# Patient Record
Sex: Female | Born: 1935 | Race: White | Hispanic: No | Marital: Single | Smoking: Never smoker
Health system: Southern US, Community
[De-identification: ages and names within clinical notes are randomized; demographics above are authoritative.]

## PROBLEM LIST (undated history)

## (undated) DIAGNOSIS — D649 Anemia, unspecified: Secondary | ICD-10-CM

## (undated) DIAGNOSIS — F79 Unspecified intellectual disabilities: Secondary | ICD-10-CM

## (undated) DIAGNOSIS — K5790 Diverticulosis of intestine, part unspecified, without perforation or abscess without bleeding: Secondary | ICD-10-CM

## (undated) DIAGNOSIS — K219 Gastro-esophageal reflux disease without esophagitis: Secondary | ICD-10-CM

## (undated) DIAGNOSIS — I4891 Unspecified atrial fibrillation: Secondary | ICD-10-CM

## (undated) DIAGNOSIS — J189 Pneumonia, unspecified organism: Secondary | ICD-10-CM

## (undated) DIAGNOSIS — G309 Alzheimer's disease, unspecified: Secondary | ICD-10-CM

## (undated) DIAGNOSIS — F0390 Unspecified dementia without behavioral disturbance: Secondary | ICD-10-CM

## (undated) DIAGNOSIS — F0280 Dementia in other diseases classified elsewhere without behavioral disturbance: Secondary | ICD-10-CM

## (undated) DIAGNOSIS — G40909 Epilepsy, unspecified, not intractable, without status epilepticus: Secondary | ICD-10-CM

## (undated) DIAGNOSIS — E785 Hyperlipidemia, unspecified: Secondary | ICD-10-CM

## (undated) DIAGNOSIS — Z86010 Personal history of colonic polyps: Secondary | ICD-10-CM

## (undated) DIAGNOSIS — M1990 Unspecified osteoarthritis, unspecified site: Secondary | ICD-10-CM

## (undated) DIAGNOSIS — E46 Unspecified protein-calorie malnutrition: Secondary | ICD-10-CM

## (undated) DIAGNOSIS — E039 Hypothyroidism, unspecified: Secondary | ICD-10-CM

## (undated) HISTORY — DX: Diverticulosis of intestine, part unspecified, without perforation or abscess without bleeding: K57.90

## (undated) HISTORY — PX: HEMORRHOID SURGERY: SHX153

## (undated) HISTORY — DX: Unspecified osteoarthritis, unspecified site: M19.90

## (undated) HISTORY — DX: Unspecified atrial fibrillation: I48.91

## (undated) HISTORY — DX: Anemia, unspecified: D64.9

## (undated) HISTORY — DX: Hyperlipidemia, unspecified: E78.5

## (undated) HISTORY — DX: Pneumonia, unspecified organism: J18.9

## (undated) HISTORY — DX: Epilepsy, unspecified, not intractable, without status epilepticus: G40.909

## (undated) HISTORY — DX: Dementia in other diseases classified elsewhere, unspecified severity, without behavioral disturbance, psychotic disturbance, mood disturbance, and anxiety: F02.80

## (undated) HISTORY — DX: Unspecified intellectual disabilities: F79

## (undated) HISTORY — DX: Personal history of colonic polyps: Z86.010

## (undated) HISTORY — PX: CHOLECYSTECTOMY: SHX55

## (undated) HISTORY — DX: Alzheimer's disease, unspecified: G30.9

---

## 2008-06-22 DIAGNOSIS — Z86010 Personal history of colonic polyps: Secondary | ICD-10-CM

## 2008-06-22 HISTORY — DX: Personal history of colonic polyps: Z86.010

## 2009-01-12 ENCOUNTER — Ambulatory Visit: Payer: Self-pay | Admitting: Cardiology

## 2009-01-12 ENCOUNTER — Inpatient Hospital Stay (HOSPITAL_COMMUNITY): Admission: EM | Admit: 2009-01-12 | Discharge: 2009-02-01 | Payer: Self-pay | Admitting: Emergency Medicine

## 2009-01-14 ENCOUNTER — Ambulatory Visit: Payer: Self-pay | Admitting: Internal Medicine

## 2009-01-15 ENCOUNTER — Encounter (INDEPENDENT_AMBULATORY_CARE_PROVIDER_SITE_OTHER): Payer: Self-pay | Admitting: Internal Medicine

## 2009-01-15 ENCOUNTER — Ambulatory Visit: Payer: Self-pay | Admitting: Internal Medicine

## 2009-01-17 ENCOUNTER — Ambulatory Visit: Payer: Self-pay | Admitting: Gastroenterology

## 2009-01-18 ENCOUNTER — Ambulatory Visit: Payer: Self-pay | Admitting: Gastroenterology

## 2009-01-18 ENCOUNTER — Encounter: Payer: Self-pay | Admitting: Gastroenterology

## 2009-01-18 HISTORY — PX: ESOPHAGOGASTRODUODENOSCOPY: SHX1529

## 2009-01-18 HISTORY — PX: COLONOSCOPY W/ POLYPECTOMY: SHX1380

## 2009-01-22 ENCOUNTER — Encounter (INDEPENDENT_AMBULATORY_CARE_PROVIDER_SITE_OTHER): Payer: Self-pay | Admitting: Radiology

## 2009-01-28 ENCOUNTER — Encounter: Payer: Self-pay | Admitting: Internal Medicine

## 2009-02-01 ENCOUNTER — Inpatient Hospital Stay: Admission: AD | Admit: 2009-02-01 | Discharge: 2009-02-12 | Payer: Self-pay | Admitting: Internal Medicine

## 2009-02-05 DIAGNOSIS — K2990 Gastroduodenitis, unspecified, without bleeding: Secondary | ICD-10-CM

## 2009-02-05 DIAGNOSIS — I4891 Unspecified atrial fibrillation: Secondary | ICD-10-CM

## 2009-02-05 DIAGNOSIS — D649 Anemia, unspecified: Secondary | ICD-10-CM | POA: Insufficient documentation

## 2009-02-05 DIAGNOSIS — K2970 Gastritis, unspecified, without bleeding: Secondary | ICD-10-CM | POA: Insufficient documentation

## 2009-02-05 DIAGNOSIS — R569 Unspecified convulsions: Secondary | ICD-10-CM

## 2009-02-15 ENCOUNTER — Ambulatory Visit: Payer: Self-pay | Admitting: Cardiology

## 2009-02-15 ENCOUNTER — Encounter (INDEPENDENT_AMBULATORY_CARE_PROVIDER_SITE_OTHER): Payer: Self-pay | Admitting: *Deleted

## 2009-02-15 LAB — CONVERTED CEMR LAB
BUN: 29 mg/dL
CO2: 24 meq/L
Chloride: 101 meq/L
Glucose, Bld: 89 mg/dL
Potassium: 4.5 meq/L

## 2009-02-18 ENCOUNTER — Encounter: Payer: Self-pay | Admitting: Cardiology

## 2009-02-18 LAB — CONVERTED CEMR LAB
Basophils Absolute: 0 10*3/uL (ref 0.0–0.1)
Basophils Relative: 0 % (ref 0–1)
CO2: 24 meq/L (ref 19–32)
Calcium: 9 mg/dL (ref 8.4–10.5)
Hemoglobin: 11.4 g/dL — ABNORMAL LOW (ref 12.0–15.0)
Lymphocytes Relative: 16 % (ref 12–46)
MCHC: 31.1 g/dL (ref 30.0–36.0)
Monocytes Relative: 12 % (ref 3–12)
Neutro Abs: 5.7 10*3/uL (ref 1.7–7.7)
Neutrophils Relative %: 69 % (ref 43–77)
Potassium: 4.5 meq/L (ref 3.5–5.3)
RBC: 4.54 M/uL (ref 3.87–5.11)
Sodium: 135 meq/L (ref 135–145)

## 2009-02-27 ENCOUNTER — Encounter: Payer: Self-pay | Admitting: Cardiology

## 2009-03-01 ENCOUNTER — Telehealth: Payer: Self-pay | Admitting: Adult Health

## 2009-03-04 ENCOUNTER — Telehealth: Payer: Self-pay | Admitting: Adult Health

## 2009-03-06 ENCOUNTER — Ambulatory Visit: Payer: Self-pay | Admitting: Cardiology

## 2009-03-06 ENCOUNTER — Ambulatory Visit (HOSPITAL_COMMUNITY): Admission: RE | Admit: 2009-03-06 | Discharge: 2009-03-06 | Payer: Self-pay | Admitting: Cardiology

## 2009-03-06 ENCOUNTER — Encounter: Payer: Self-pay | Admitting: Adult Health

## 2009-03-06 DIAGNOSIS — M79609 Pain in unspecified limb: Secondary | ICD-10-CM

## 2009-03-06 DIAGNOSIS — Z9189 Other specified personal risk factors, not elsewhere classified: Secondary | ICD-10-CM

## 2009-03-06 LAB — CONVERTED CEMR LAB
Eosinophils Relative: 2 % (ref 0–5)
HCT: 36.5 % (ref 36.0–46.0)
Hemoglobin: 12.4 g/dL (ref 12.0–15.0)
Lymphocytes Relative: 16 % (ref 12–46)
Lymphs Abs: 1.2 10*3/uL (ref 0.7–4.0)
MCV: 80.4 fL (ref 78.0–100.0)
Monocytes Absolute: 1 10*3/uL (ref 0.1–1.0)
WBC: 7.4 10*3/uL (ref 4.0–10.5)

## 2009-04-02 ENCOUNTER — Ambulatory Visit: Payer: Self-pay | Admitting: Cardiology

## 2009-05-21 ENCOUNTER — Ambulatory Visit: Payer: Self-pay | Admitting: Gastroenterology

## 2009-07-05 ENCOUNTER — Encounter (INDEPENDENT_AMBULATORY_CARE_PROVIDER_SITE_OTHER): Payer: Self-pay | Admitting: *Deleted

## 2009-07-15 ENCOUNTER — Ambulatory Visit: Payer: Self-pay | Admitting: Cardiology

## 2009-08-18 ENCOUNTER — Inpatient Hospital Stay (HOSPITAL_COMMUNITY): Admission: EM | Admit: 2009-08-18 | Discharge: 2009-09-02 | Payer: Self-pay | Admitting: Emergency Medicine

## 2009-08-22 ENCOUNTER — Ambulatory Visit: Payer: Self-pay | Admitting: Internal Medicine

## 2009-08-25 ENCOUNTER — Encounter: Payer: Self-pay | Admitting: Internal Medicine

## 2009-09-02 ENCOUNTER — Inpatient Hospital Stay: Admission: AD | Admit: 2009-09-02 | Discharge: 2009-10-15 | Payer: Self-pay | Admitting: Internal Medicine

## 2010-04-04 ENCOUNTER — Ambulatory Visit: Payer: Self-pay | Admitting: Cardiology

## 2010-07-22 NOTE — Assessment & Plan Note (Signed)
Summary: E4V   Visit Type:  Follow-up Primary [name] [name]:  Dr. Dwana Melena   History of Present Illness: 77 year old woman presents for followup. I last saw her in October 2010 and she was seen in the interim by Ms. Lawrence in January of this year. She is here with her son. Her family is very supportive and provides all of her medications. She does not endorse any complaints of chest pain or palpitations, and her son states that she's been doing very well. Labs have been followed by Dr. Margo Aye over the last several months. Her TSH was 10.0 back in March, AST and ALT were normal as of June. Lipids also looked good at that time with an LDL of 85.  I reviewed her medications. She remains on low-dose amiodarone. We did discuss the possibility of cutting it back even further. She is not on Coumadin with history of falls and also seizure disorder. She has been able to take aspirin.  Current Medications (verified): 1)  Aricept 5 Mg Tabs (Donepezil Hcl) .... Take 1 Tab Daily 2)  Simvastatin 20 Mg Tabs (Simvastatin) .... Take 1 Tab Daily 3)  Aspirin 81 Mg Tbec (Aspirin) .... Take One Tablet By Mouth Daily 4)  Nu-Iron 150 Mg Caps (Polysaccharide Iron Complex) .... Take 1 Tab Two Times A Day 5)  Omeprazole 20 Mg Cpdr (Omeprazole) .... Take 1 Tab Daily 6)  Amiodarone Hcl 200 Mg Tabs (Amiodarone Hcl) .... Take 1 Tablet By Mouth Once Daily 7)  Klor-Con M20 20 Meq Cr-Tabs (Potassium Chloride Crys Cr) .... Take 2 Tabs Daily 8)  Caltrate 600+d Plus 600-400 Mg-Unit Tabs (Calcium Carbonate-Vit D-Min) 9)  Colace 100 Mg Caps (Docusate Sodium) .... Take One Tablet By Mouth At Bedtime 10)  Ocuvite  Tabs (Multiple Vitamins-Minerals) .... Take 1 Tab Daily  Allergies (verified): No Known Drug Allergies  Past History:  Past Medical History: Last updated: 05/21/2009 Anemia - gastritis/duodenitis Atrial Fibrillation Seizure disorder Mental retardation Alzheimer dementia Hyperlipidemia Osteoarthritis Colonic  polyps and diverticulosis: TCS 2010 Pneumonia August 2010  Social History: Last updated: 02/05/2009 Disabled  Single  Tobacco Use - No.  Alcohol Use - no Regular Exercise - no Drug Use - no  Review of Systems  The patient denies anorexia, fever, chest pain, syncope, dyspnea on exertion, peripheral edema, and abdominal pain.         Otherwise reviewed and negative.  Vital Signs:  Patient profile:   77 year old female Weight:      93 pounds BMI:     20.20 Pulse rate:   77 / minute BP sitting:   115 / 57  (right arm)  Vitals Entered By: Dreama Saa, CNA (April 04, 2010 9:17 AM)  Physical Exam  Additional Exam:  Chronically ill appearing, elderly woman, no acute distress. HEENT: Conjunctiva lids normal, oropharynx with poor dentition Neck: Supple, no carotid bruits or thyromegaly. Lungs: Clear with diminished breath sounds. Nonlabored. Cardiac: Regular rate and rhythm, somewhat distant, no loud systolic murmur or gallop. Extremities: No pitting edema, distal pulses one plus. Neuropsychiatric: Alert and oriented x2.   EKG  Procedure date:  04/04/2010  Findings:      Sinus rhythm at 66 beats per minute with incomplete right bundle branch block, left anterior fascicular block, nonspecific ST changes, QTC 469 ms.  Impression & Recommendations:  Problem # 1:  ATRIAL FIBRILLATION, PAROXYSMAL (ICD-427.31)  Maintaining sinus rhythm with no complaint of palpitations on amiodarone and aspirin. No Coumadin with history of falls and seizure disorder.  I recommended that she get followup TSH and LFTs with Dr. Margo Aye. Next routine visit in 6 months.  Her updated medication list for this problem includes:    Aspirin 81 Mg Tbec (Aspirin) .Marland Kitchen... Take one tablet by mouth daily    Amiodarone Hcl 200 Mg Tabs (Amiodarone hcl) .Marland Kitchen... Take 1 tablet by mouth once daily  Patient Instructions: 1)  Your physician recommends that you schedule a follow-up appointment in: 6 MONTHS 2)  Your  physician recommends that you continue on your current medications as directed. Please refer to the Current Medication list given to you today.

## 2010-07-22 NOTE — Miscellaneous (Signed)
Summary: LABS 02/15/2009 BMP  Clinical Lists Changes  Observations: Added new observation of CALCIUM: 9.0 mg/dL (04/54/0981 19:14) Added new observation of CREATININE: 0.95 mg/dL (78/29/5621 30:86) Added new observation of BUN: 29 mg/dL (57/84/6962 95:28) Added new observation of BG RANDOM: 89 mg/dL (41/32/4401 02:72) Added new observation of CO2 PLSM/SER: 24 meq/L (02/15/2009 16:55) Added new observation of CL SERUM: 101 meq/L (02/15/2009 16:55) Added new observation of K SERUM: 4.5 meq/L (02/15/2009 16:55) Added new observation of NA: 135 meq/L (02/15/2009 16:55)

## 2010-07-22 NOTE — Assessment & Plan Note (Signed)
Summary: F3M   Visit Type:  Follow-up Primary Provider:  Margo Aye, M.D.    History of Present Illness: [name] [name] is a very sweet mentally retarded CF we are following for afib.  Her cousin is here with her as always.  We intially saw her on consultation in the hospital for AFib and she has been on amioderone since. At Last visit with Dr. Diona Browner in Oct 2010, her amioderone was decreased to 200mg  daily.  She has tolerated this well, with no recurrance of afib RVR.  She has had no further falls. She is compliant with her medications.  Her cousin, with whom she lives, makes sure that she takes them.  She has no complaints.   Current Problems (verified): 1)  Accidental Fall, Hx of  (ICD-V15.89) 2)  Accidental Fall On or From Sidewalk Curb  (ICD-E880.1) 3)  Limb Pain  (ICD-729.5) 4)  Atrial Fibrillation With Rapid Ventricular Response  (ICD-427.31) 5)  Gastritis  (ICD-535.50) 6)  Seizure Disorder  (ICD-780.39) 7)  Atrial Fibrillation, Paroxysmal  (ICD-427.31) 8)  Anemia  (ICD-285.9)  Current Medications (verified): 1)  Aricept 5 Mg Tabs (Donepezil Hcl) .... Take 1 Tab Daily 2)  Simvastatin 20 Mg Tabs (Simvastatin) .... Take 1 Tab Daily 3)  Aspirin 81 Mg Tbec (Aspirin) .... Take One Tablet By Mouth Daily 4)  Nu-Iron 150 Mg Caps (Polysaccharide Iron Complex) .... Take 1 Tab Daily 5)  Omeprazole 20 Mg Cpdr (Omeprazole) .... Take 1 Tab Daily 6)  Amiodarone Hcl 200 Mg Tabs (Amiodarone Hcl) .... Take 1 Tablet By Mouth Once Daily 7)  Klor-Con M20 20 Meq Cr-Tabs (Potassium Chloride Crys Cr) .... Take 2 Tabs Daily 8)  Daily Multi  Tabs (Multiple Vitamins-Minerals) 9)  Vitamin B-12 100 Mcg Tabs (Cyanocobalamin) 10)  Caltrate 600+d Plus 600-400 Mg-Unit Tabs (Calcium Carbonate-Vit D-Min) 11)  Colace 100 Mg Caps (Docusate Sodium) .... Take One Tablet By Mouth At Bedtime 12)  Fosamax 70 Mg Tabs (Alendronate Sodium) .... Take 1 Tab Weekly  Allergies (verified): No Known Drug Allergies PMH-FH-SH  reviewed-no changes except otherwise noted  Review of Systems  The patient denies anorexia, fever, weight loss, weight gain, vision loss, decreased hearing, hoarseness, chest pain, syncope, dyspnea on exertion, peripheral edema, prolonged cough, headaches, hemoptysis, abdominal pain, melena, hematochezia, severe indigestion/heartburn, hematuria, incontinence, genital sores, muscle weakness, suspicious skin lesions, transient blindness, difficulty walking, depression, unusual weight change, abnormal bleeding, enlarged lymph nodes, angioedema, breast masses, and testicular masses.    Vital Signs:  Patient profile:   77 year old female Weight:      97 pounds Pulse rate:   69 / minute BP sitting:   122 / 55  (right arm)  Vitals Entered By: Dreama Saa, CNA (July 15, 2009 2:22 PM)  Physical Exam  General:  Well developed, well nourished, in no acute distress. Head:  normocephalic and atraumatic Eyes:  PERRLA/EOM intact; conjunctiva and lids normal. Ears:  TM's intact and clear with normal canals and hearing Nose:  no deformity, discharge, inflammation, or lesions Mouth:  Teeth, gums and palate normal. Oral mucosa normal. Lungs:  Clear bilaterally to auscultation and percussion. Heart:  Non-displaced PMI, chest non-tender; regular rate and rhythm, S1, S2 without murmurs, rubs or gallops. Carotid upstroke normal, no bruit. Normal abdominal aortic size, no bruits. Femorals normal pulses, no bruits. Pedals normal pulses. No edema, no varicosities. Abdomen:  Bowel sounds positive; abdomen soft and non-tender without masses, organomegaly, or hernias noted. No hepatosplenomegaly. Msk:  Back normal, normal gait. Muscle  strength and tone normal. Extremities:  No clubbing or cyanosis. Neurologic:  Alert and oriented x 3. Psych:  Normal affect.   Impression & Recommendations:  Problem # 1:  ATRIAL FIBRILLATION WITH RAPID VENTRICULAR RESPONSE (ICD-427.31) Assessment Unchanged She remains in  NSR with controlled rate on lower dose of amioderone.  No complaints of breathing problems or issues.  Will need TSH checked on next blood draw with primary.  She is stable at this time from CV standpoint. Her updated medication list for this problem includes:    Aspirin 81 Mg Tbec (Aspirin) .Marland Kitchen... Take one tablet by mouth daily    Amiodarone Hcl 200 Mg Tabs (Amiodarone hcl) .Marland Kitchen... Take 1 tablet by mouth once daily  Patient Instructions: 1)  Your physician recommends that you schedule a follow-up appointment in: 6 months 2)  Your physician recommends that you continue on your current medications as directed. Please refer to the Current Medication list given to you today. Prescriptions: AMIODARONE HCL 200 MG TABS (AMIODARONE HCL) Take 1 tablet by mouth once daily  #30 x 6   Entered by:   Larita Fife Via LPN   Authorized by:   Joni Reining, NP   Signed by:   Larita Fife Via LPN on 13/01/6577   Method used:   Electronically to        [facility] (retail)       [address]       [place], [place]  [address]       Ph: [phone]       Fax: [phone]   RxID:   [id number]

## 2010-09-11 LAB — DIFFERENTIAL
Basophils Relative: 0 % (ref 0–1)
Basophils Relative: 1 % (ref 0–1)
Eosinophils Absolute: 0.1 10*3/uL (ref 0.0–0.7)
Eosinophils Relative: 1 % (ref 0–5)
Lymphocytes Relative: 3 % — ABNORMAL LOW (ref 12–46)
Monocytes Absolute: 0.5 10*3/uL (ref 0.1–1.0)
Monocytes Absolute: 0.7 10*3/uL (ref 0.1–1.0)
Monocytes Relative: 5 % (ref 3–12)
Monocytes Relative: 8 % (ref 3–12)
Neutro Abs: 9.9 10*3/uL — ABNORMAL HIGH (ref 1.7–7.7)
Neutrophils Relative %: 83 % — ABNORMAL HIGH (ref 43–77)

## 2010-09-11 LAB — URINALYSIS, ROUTINE W REFLEX MICROSCOPIC
Bilirubin Urine: NEGATIVE
Glucose, UA: NEGATIVE mg/dL
Hgb urine dipstick: NEGATIVE
Ketones, ur: NEGATIVE mg/dL
Protein, ur: NEGATIVE mg/dL

## 2010-09-11 LAB — POCT I-STAT, CHEM 8
BUN: 19 mg/dL (ref 6–23)
Chloride: 109 mEq/L (ref 96–112)
Sodium: 136 mEq/L (ref 135–145)

## 2010-09-11 LAB — POCT CARDIAC MARKERS
CKMB, poc: <1 ng/mL — ABNORMAL LOW (ref 1.0–8.0)
Myoglobin, poc: 43.1 ng/mL (ref 12–200)
Troponin i, poc: <0.05 ng/mL (ref 0.00–0.09)

## 2010-09-11 LAB — MAGNESIUM: Magnesium: 1.9 mg/dL (ref 1.5–2.5)

## 2010-09-11 LAB — CBC
Hemoglobin: 11.1 g/dL — ABNORMAL LOW (ref 12.0–15.0)
Hemoglobin: 13.2 g/dL (ref 12.0–15.0)
MCHC: 35 g/dL (ref 30.0–36.0)
RBC: 3.56 MIL/uL — ABNORMAL LOW (ref 3.87–5.11)
RBC: 4.17 MIL/uL (ref 3.87–5.11)
RDW: 13.7 % (ref 11.5–15.5)

## 2010-09-11 LAB — COMPREHENSIVE METABOLIC PANEL
ALT: 16 U/L (ref 0–35)
AST: 19 U/L (ref 0–37)
Alkaline Phosphatase: 34 U/L — ABNORMAL LOW (ref 39–117)
GFR calc Af Amer: >60 mL/min (ref >60)
Glucose, Bld: 88 mg/dL (ref 70–99)
Potassium: 3.9 mEq/L (ref 3.5–5.1)
Sodium: 140 mEq/L (ref 135–145)
Total Protein: 4.9 g/dL — ABNORMAL LOW (ref 6.0–8.3)

## 2010-09-11 LAB — CULTURE, BLOOD (ROUTINE X 2): Culture: NO GROWTH

## 2010-09-11 LAB — CARDIAC PANEL(CRET KIN+CKTOT+MB+TROPI)
CK, MB: 1.5 ng/mL (ref 0.3–4.0)
CK, MB: 1.5 ng/mL (ref 0.3–4.0)
Total CK: 43 U/L (ref 7–177)

## 2010-09-11 LAB — CK TOTAL AND CKMB (NOT AT ARMC): Relative Index: INVALID (ref 0.0–2.5)

## 2010-09-11 LAB — URINE CULTURE
Colony Count: NO GROWTH
Culture: NO GROWTH

## 2010-09-11 LAB — RAPID URINE DRUG SCREEN, HOSP PERFORMED
Opiates: NOT DETECTED
Tetrahydrocannabinol: NOT DETECTED

## 2010-09-11 LAB — SALICYLATE LEVEL: Salicylate Lvl: <4 mg/dL (ref 2.8–20.0)

## 2010-09-11 LAB — URINE MICROSCOPIC-ADD ON

## 2010-09-15 LAB — BLOOD GAS, ARTERIAL
Acid-Base Excess: 1.9 mmol/L (ref 0.0–2.0)
Acid-base deficit: 7.1 mmol/L — ABNORMAL HIGH (ref 0.0–2.0)
Acid-base deficit: 3.7 mmol/L — ABNORMAL HIGH (ref 0.0–2.0)
Acid-base deficit: 4.3 mmol/L — ABNORMAL HIGH (ref 0.0–2.0)
Acid-base deficit: 4.8 mmol/L — ABNORMAL HIGH (ref 0.0–2.0)
Acid-base deficit: 5 mmol/L — ABNORMAL HIGH (ref 0.0–2.0)
Acid-base deficit: 5.6 mmol/L — ABNORMAL HIGH (ref 0.0–2.0)
Acid-base deficit: 7.2 mmol/L — ABNORMAL HIGH (ref 0.0–2.0)
Bicarbonate: 16.3 mEq/L — ABNORMAL LOW (ref 20.0–24.0)
Bicarbonate: 19.8 mEq/L — ABNORMAL LOW (ref 20.0–24.0)
Bicarbonate: 20.1 mEq/L (ref 20.0–24.0)
Bicarbonate: 20.3 mEq/L (ref 20.0–24.0)
Bicarbonate: 25.3 mEq/L — ABNORMAL HIGH (ref 20.0–24.0)
Bicarbonate: 30.9 mEq/L — ABNORMAL HIGH (ref 20.0–24.0)
Bicarbonate: 32.9 mEq/L — ABNORMAL HIGH (ref 20.0–24.0)
Drawn by: 280971
Drawn by: 29757
Drawn by: 297571
FIO2: 0.4 %
FIO2: 0.4 %
FIO2: 0.5 %
FIO2: 0.6 %
FIO2: 40 %
FIO2: 50 %
MECHVT: 0.38 mL
MECHVT: 340 mL
MECHVT: 340 mL
MECHVT: 340 mL
MECHVT: 350 mL
MECHVT: 380 mL
MECHVT: 380 mL
MECHVT: 380 mL
MECHVT: 380 mL
O2 Content: 3.5 L/min
O2 Content: 50 L/min
O2 Content: 40 L/min
O2 Saturation: 92.3 %
O2 Saturation: 93.1 %
O2 Saturation: 94.8 %
O2 Saturation: 95 %
O2 Saturation: 96.9 %
O2 Saturation: 97.3 %
O2 Saturation: 98.3 %
O2 Saturation: 99.4 %
PEEP: 10 cmH2O
PEEP: 5 cmH2O
PEEP: 5 cmH2O
PEEP: 5 cmH2O
PEEP: 5 cmH2O
Patient temperature: 37
Patient temperature: 37
Patient temperature: 100.2
Patient temperature: 98.6
Patient temperature: 98.6
Patient temperature: 98.6
Patient temperature: 98.6
Patient temperature: 98.6
Patient temperature: 98.6
Patient temperature: 98.6
Pressure support: 5 cmH2O
RATE: 12 resp/min
RATE: 18 resp/min
RATE: 20 resp/min
RATE: 20 resp/min
RATE: 20 resp/min
RATE: 20 resp/min
RATE: 30 resp/min
RATE: 30 resp/min
TCO2: 14.6 mmol/L (ref 0–100)
TCO2: 20.7 mmol/L (ref 0–100)
TCO2: 20.8 mmol/L (ref 0–100)
TCO2: 21.2 mmol/L (ref 0–100)
TCO2: 21.6 mmol/L (ref 0–100)
TCO2: 21.9 mmol/L (ref 0–100)
TCO2: 26.4 mmol/L (ref 0–100)
TCO2: 32 mmol/L (ref 0–100)
pCO2 arterial: 24.7 mmHg — ABNORMAL LOW (ref 35.0–45.0)
pCO2 arterial: 36.4 mmHg (ref 35.0–45.0)
pCO2 arterial: 37.2 mmHg (ref 35.0–45.0)
pCO2 arterial: 37.6 mmHg (ref 35.0–45.0)
pCO2 arterial: 39.1 mmHg (ref 35.0–45.0)
pCO2 arterial: 39.3 mmHg (ref 35.0–45.0)
pCO2 arterial: 42.3 mmHg (ref 35.0–45.0)
pCO2 arterial: 42.3 mmHg (ref 35.0–45.0)
pCO2 arterial: 49.8 mmHg — ABNORMAL HIGH (ref 35.0–45.0)
pH, Arterial: 7.436 — ABNORMAL HIGH (ref 7.350–7.400)
pH, Arterial: 7.474 — ABNORMAL HIGH (ref 7.350–7.400)
pH, Arterial: 7.302 — ABNORMAL LOW (ref 7.350–7.400)
pH, Arterial: 7.314 — ABNORMAL LOW (ref 7.350–7.400)
pH, Arterial: 7.347 — ABNORMAL LOW (ref 7.350–7.400)
pH, Arterial: 7.529 — ABNORMAL HIGH (ref 7.350–7.400)
pH, Arterial: 7.545 — ABNORMAL HIGH (ref 7.350–7.400)
pO2, Arterial: 57.9 mmHg — ABNORMAL LOW (ref 80.0–100.0)
pO2, Arterial: 60.6 mmHg — ABNORMAL LOW (ref 80.0–100.0)
pO2, Arterial: 79.3 mmHg — ABNORMAL LOW (ref 80.0–100.0)
pO2, Arterial: 85.4 mmHg (ref 80.0–100.0)
pO2, Arterial: 86.4 mmHg (ref 80.0–100.0)

## 2010-09-15 LAB — CULTURE, BLOOD (ROUTINE X 2)
Culture: NO GROWTH
Report Status: 3072011

## 2010-09-15 LAB — PROTIME-INR
INR: 1.4 (ref 0.00–1.49)
INR: 1.35 (ref 0.00–1.49)
Prothrombin Time: 17 s — ABNORMAL HIGH (ref 11.6–15.2)
Prothrombin Time: 16.6 seconds — ABNORMAL HIGH (ref 11.6–15.2)

## 2010-09-15 LAB — CBC
HCT: 31.5 % — ABNORMAL LOW (ref 36.0–46.0)
HCT: 33 % — ABNORMAL LOW (ref 36.0–46.0)
HCT: 28 % — ABNORMAL LOW (ref 36.0–46.0)
HCT: 29.5 % — ABNORMAL LOW (ref 36.0–46.0)
HCT: 30.1 % — ABNORMAL LOW (ref 36.0–46.0)
HCT: 30.1 % — ABNORMAL LOW (ref 36.0–46.0)
HCT: 30.5 % — ABNORMAL LOW (ref 36.0–46.0)
HCT: 30.5 % — ABNORMAL LOW (ref 36.0–46.0)
Hemoglobin: 11.1 g/dL — ABNORMAL LOW (ref 12.0–15.0)
Hemoglobin: 11.6 g/dL — ABNORMAL LOW (ref 12.0–15.0)
Hemoglobin: 10.4 g/dL — ABNORMAL LOW (ref 12.0–15.0)
Hemoglobin: 10.4 g/dL — ABNORMAL LOW (ref 12.0–15.0)
Hemoglobin: 11.1 g/dL — ABNORMAL LOW (ref 12.0–15.0)
Hemoglobin: 9.7 g/dL — ABNORMAL LOW (ref 12.0–15.0)
Hemoglobin: 9.9 g/dL — ABNORMAL LOW (ref 12.0–15.0)
MCHC: 35.2 g/dL (ref 30.0–36.0)
MCHC: 34.2 g/dL (ref 30.0–36.0)
MCHC: 34.3 g/dL (ref 30.0–36.0)
MCHC: 34.4 g/dL (ref 30.0–36.0)
MCHC: 34.5 g/dL (ref 30.0–36.0)
MCHC: 34.6 g/dL (ref 30.0–36.0)
MCHC: 34.7 g/dL (ref 30.0–36.0)
MCV: 89.2 fL (ref 78.0–100.0)
MCV: 89.7 fL (ref 78.0–100.0)
MCV: 89.9 fL (ref 78.0–100.0)
MCV: 90.1 fL (ref 78.0–100.0)
MCV: 90.5 fL (ref 78.0–100.0)
MCV: 90.8 fL (ref 78.0–100.0)
Platelets: 105 K/uL — ABNORMAL LOW (ref 150–400)
Platelets: 113 10*3/uL — ABNORMAL LOW (ref 150–400)
Platelets: 113 10*3/uL — ABNORMAL LOW (ref 150–400)
Platelets: 116 10*3/uL — ABNORMAL LOW (ref 150–400)
Platelets: 137 10*3/uL — ABNORMAL LOW (ref 150–400)
Platelets: 168 10*3/uL (ref 150–400)
Platelets: 175 10*3/uL (ref 150–400)
Platelets: 180 10*3/uL (ref 150–400)
RBC: 3.53 MIL/uL — ABNORMAL LOW (ref 3.87–5.11)
RBC: 3.67 MIL/uL — ABNORMAL LOW (ref 3.87–5.11)
RBC: 3.12 MIL/uL — ABNORMAL LOW (ref 3.87–5.11)
RBC: 3.15 MIL/uL — ABNORMAL LOW (ref 3.87–5.11)
RBC: 3.32 MIL/uL — ABNORMAL LOW (ref 3.87–5.11)
RBC: 3.34 MIL/uL — ABNORMAL LOW (ref 3.87–5.11)
RBC: 3.55 MIL/uL — ABNORMAL LOW (ref 3.87–5.11)
RBC: 3.66 MIL/uL — ABNORMAL LOW (ref 3.87–5.11)
RDW: 13.7 % (ref 11.5–15.5)
RDW: 13.7 % (ref 11.5–15.5)
RDW: 13.6 % (ref 11.5–15.5)
RDW: 13.8 % (ref 11.5–15.5)
RDW: 13.9 % (ref 11.5–15.5)
RDW: 14 % (ref 11.5–15.5)
RDW: 14 % (ref 11.5–15.5)
RDW: 14.2 % (ref 11.5–15.5)
RDW: 14.5 % (ref 11.5–15.5)
WBC: 8 K/uL (ref 4.0–10.5)
WBC: 11 10*3/uL — ABNORMAL HIGH (ref 4.0–10.5)
WBC: 12.3 10*3/uL — ABNORMAL HIGH (ref 4.0–10.5)
WBC: 12.8 10*3/uL — ABNORMAL HIGH (ref 4.0–10.5)
WBC: 22 10*3/uL — ABNORMAL HIGH (ref 4.0–10.5)
WBC: 6.5 10*3/uL (ref 4.0–10.5)
WBC: 9 10*3/uL (ref 4.0–10.5)
WBC: 9.4 10*3/uL (ref 4.0–10.5)

## 2010-09-15 LAB — GLUCOSE, CAPILLARY
Glucose-Capillary: 106 mg/dL — ABNORMAL HIGH (ref 70–99)
Glucose-Capillary: 113 mg/dL — ABNORMAL HIGH (ref 70–99)
Glucose-Capillary: 119 mg/dL — ABNORMAL HIGH (ref 70–99)
Glucose-Capillary: 121 mg/dL — ABNORMAL HIGH (ref 70–99)
Glucose-Capillary: 125 mg/dL — ABNORMAL HIGH (ref 70–99)
Glucose-Capillary: 125 mg/dL — ABNORMAL HIGH (ref 70–99)
Glucose-Capillary: 128 mg/dL — ABNORMAL HIGH (ref 70–99)
Glucose-Capillary: 129 mg/dL — ABNORMAL HIGH (ref 70–99)
Glucose-Capillary: 141 mg/dL — ABNORMAL HIGH (ref 70–99)
Glucose-Capillary: 142 mg/dL — ABNORMAL HIGH (ref 70–99)
Glucose-Capillary: 143 mg/dL — ABNORMAL HIGH (ref 70–99)
Glucose-Capillary: 145 mg/dL — ABNORMAL HIGH (ref 70–99)
Glucose-Capillary: 163 mg/dL — ABNORMAL HIGH (ref 70–99)
Glucose-Capillary: 186 mg/dL — ABNORMAL HIGH (ref 70–99)
Glucose-Capillary: 77 mg/dL (ref 70–99)
Glucose-Capillary: 88 mg/dL (ref 70–99)

## 2010-09-15 LAB — URINALYSIS, ROUTINE W REFLEX MICROSCOPIC
Nitrite: NEGATIVE
Protein, ur: NEGATIVE mg/dL
Specific Gravity, Urine: 1.025 (ref 1.005–1.030)
Urobilinogen, UA: 0.2 mg/dL (ref 0.0–1.0)

## 2010-09-15 LAB — COMPREHENSIVE METABOLIC PANEL WITH GFR
AST: 51 U/L — ABNORMAL HIGH (ref 0–37)
Albumin: 2.9 g/dL — ABNORMAL LOW (ref 3.5–5.2)
BUN: 10 mg/dL (ref 6–23)
Calcium: 8.3 mg/dL — ABNORMAL LOW (ref 8.4–10.5)
Creatinine, Ser: 1.17 mg/dL (ref 0.4–1.2)
GFR calc Af Amer: 54 mL/min — ABNORMAL LOW (ref >60)
GFR calc non Af Amer: 45 mL/min — ABNORMAL LOW (ref >60)

## 2010-09-15 LAB — BASIC METABOLIC PANEL
BUN: 19 mg/dL (ref 6–23)
BUN: 20 mg/dL (ref 6–23)
BUN: 21 mg/dL (ref 6–23)
BUN: 24 mg/dL — ABNORMAL HIGH (ref 6–23)
BUN: 26 mg/dL — ABNORMAL HIGH (ref 6–23)
BUN: 26 mg/dL — ABNORMAL HIGH (ref 6–23)
CO2: 20 mEq/L (ref 19–32)
CO2: 22 mEq/L (ref 19–32)
CO2: 22 mEq/L (ref 19–32)
CO2: 30 mEq/L (ref 19–32)
CO2: 32 mEq/L (ref 19–32)
CO2: 36 mEq/L — ABNORMAL HIGH (ref 19–32)
Calcium: 7.3 mg/dL — ABNORMAL LOW (ref 8.4–10.5)
Calcium: 7.9 mg/dL — ABNORMAL LOW (ref 8.4–10.5)
Calcium: 8 mg/dL — ABNORMAL LOW (ref 8.4–10.5)
Calcium: 8 mg/dL — ABNORMAL LOW (ref 8.4–10.5)
Calcium: 8.2 mg/dL — ABNORMAL LOW (ref 8.4–10.5)
Chloride: 102 mEq/L (ref 96–112)
Chloride: 104 mEq/L (ref 96–112)
Chloride: 105 mEq/L (ref 96–112)
Chloride: 108 mEq/L (ref 96–112)
Chloride: 108 mEq/L (ref 96–112)
Creatinine, Ser: 0.8 mg/dL (ref 0.4–1.2)
Creatinine, Ser: 0.8 mg/dL (ref 0.4–1.2)
Creatinine, Ser: 0.87 mg/dL (ref 0.4–1.2)
Creatinine, Ser: 0.89 mg/dL (ref 0.4–1.2)
Creatinine, Ser: 0.92 mg/dL (ref 0.4–1.2)
Creatinine, Ser: 0.96 mg/dL (ref 0.4–1.2)
Creatinine, Ser: 0.98 mg/dL (ref 0.4–1.2)
GFR calc Af Amer: 57 mL/min — ABNORMAL LOW (ref >60)
GFR calc Af Amer: >60 mL/min (ref >60)
GFR calc Af Amer: >60 mL/min (ref >60)
GFR calc Af Amer: >60 mL/min (ref >60)
GFR calc Af Amer: >60 mL/min (ref >60)
GFR calc Af Amer: >60 mL/min (ref >60)
GFR calc non Af Amer: 47 mL/min — ABNORMAL LOW (ref >60)
GFR calc non Af Amer: 59 mL/min — ABNORMAL LOW (ref >60)
GFR calc non Af Amer: >60 mL/min (ref >60)
GFR calc non Af Amer: >60 mL/min (ref >60)
GFR calc non Af Amer: >60 mL/min (ref >60)
GFR calc non Af Amer: >60 mL/min (ref >60)
Glucose, Bld: 121 mg/dL — ABNORMAL HIGH (ref 70–99)
Glucose, Bld: 125 mg/dL — ABNORMAL HIGH (ref 70–99)
Glucose, Bld: 131 mg/dL — ABNORMAL HIGH (ref 70–99)
Glucose, Bld: 174 mg/dL — ABNORMAL HIGH (ref 70–99)
Glucose, Bld: 97 mg/dL (ref 70–99)
Potassium: 2.8 mEq/L — ABNORMAL LOW (ref 3.5–5.1)
Potassium: 3.8 mEq/L (ref 3.5–5.1)
Potassium: 3.9 mEq/L (ref 3.5–5.1)
Potassium: 4.1 mEq/L (ref 3.5–5.1)
Sodium: 135 mEq/L (ref 135–145)
Sodium: 141 mEq/L (ref 135–145)
Sodium: 143 mEq/L (ref 135–145)

## 2010-09-15 LAB — MAGNESIUM
Magnesium: 1.8 mg/dL (ref 1.5–2.5)
Magnesium: 2.1 mg/dL (ref 1.5–2.5)
Magnesium: 2.1 mg/dL (ref 1.5–2.5)
Magnesium: 2.2 mg/dL (ref 1.5–2.5)
Magnesium: 2.3 mg/dL (ref 1.5–2.5)
Magnesium: 2.3 mg/dL (ref 1.5–2.5)

## 2010-09-15 LAB — COMPREHENSIVE METABOLIC PANEL
ALT: 25 U/L (ref 0–35)
ALT: 38 U/L — ABNORMAL HIGH (ref 0–35)
ALT: 15 U/L (ref 0–35)
Alkaline Phosphatase: 48 U/L (ref 39–117)
Alkaline Phosphatase: 52 U/L (ref 39–117)
Alkaline Phosphatase: 36 U/L — ABNORMAL LOW (ref 39–117)
BUN: 10 mg/dL (ref 6–23)
BUN: 14 mg/dL (ref 6–23)
BUN: 8 mg/dL (ref 6–23)
CO2: 20 mEq/L (ref 19–32)
CO2: 23 mEq/L (ref 19–32)
CO2: 20 mEq/L (ref 19–32)
CO2: 21 mEq/L (ref 19–32)
CO2: 22 mEq/L (ref 19–32)
Calcium: 7.5 mg/dL — ABNORMAL LOW (ref 8.4–10.5)
Chloride: 112 mEq/L (ref 96–112)
Chloride: 110 mEq/L (ref 96–112)
Chloride: 111 mEq/L (ref 96–112)
Creatinine, Ser: 0.88 mg/dL (ref 0.4–1.2)
Creatinine, Ser: 0.89 mg/dL (ref 0.4–1.2)
GFR calc Af Amer: >60 mL/min (ref >60)
GFR calc non Af Amer: 56 mL/min — ABNORMAL LOW (ref >60)
GFR calc non Af Amer: >60 mL/min (ref >60)
GFR calc non Af Amer: >60 mL/min (ref >60)
Glucose, Bld: 101 mg/dL — ABNORMAL HIGH (ref 70–99)
Glucose, Bld: 109 mg/dL — ABNORMAL HIGH (ref 70–99)
Glucose, Bld: 123 mg/dL — ABNORMAL HIGH (ref 70–99)
Glucose, Bld: 86 mg/dL (ref 70–99)
Potassium: 3.4 mEq/L — ABNORMAL LOW (ref 3.5–5.1)
Potassium: 3.5 mEq/L (ref 3.5–5.1)
Potassium: 3 mEq/L — ABNORMAL LOW (ref 3.5–5.1)
Sodium: 141 mEq/L (ref 135–145)
Sodium: 141 mEq/L (ref 135–145)
Sodium: 140 mEq/L (ref 135–145)
Total Bilirubin: 2 mg/dL — ABNORMAL HIGH (ref 0.3–1.2)
Total Bilirubin: 2.8 mg/dL — ABNORMAL HIGH (ref 0.3–1.2)
Total Bilirubin: 1.3 mg/dL — ABNORMAL HIGH (ref 0.3–1.2)
Total Bilirubin: 1.8 mg/dL — ABNORMAL HIGH (ref 0.3–1.2)
Total Protein: 5.1 g/dL — ABNORMAL LOW (ref 6.0–8.3)
Total Protein: 5.5 g/dL — ABNORMAL LOW (ref 6.0–8.3)
Total Protein: 5.1 g/dL — ABNORMAL LOW (ref 6.0–8.3)

## 2010-09-15 LAB — PHOSPHORUS
Phosphorus: 2.4 mg/dL (ref 2.3–4.6)
Phosphorus: 2.5 mg/dL (ref 2.3–4.6)
Phosphorus: 2.9 mg/dL (ref 2.3–4.6)
Phosphorus: 4.8 mg/dL — ABNORMAL HIGH (ref 2.3–4.6)

## 2010-09-15 LAB — DIFFERENTIAL
Basophils Absolute: 0 10*3/uL (ref 0.0–0.1)
Basophils Absolute: 0 10*3/uL (ref 0.0–0.1)
Basophils Absolute: 0 10*3/uL (ref 0.0–0.1)
Basophils Absolute: 0 10*3/uL (ref 0.0–0.1)
Basophils Relative: 0 % (ref 0–1)
Basophils Relative: 0 % (ref 0–1)
Basophils Relative: 0 % (ref 0–1)
Basophils Relative: 0 % (ref 0–1)
Eosinophils Absolute: 0 10*3/uL (ref 0.0–0.7)
Eosinophils Absolute: 0 10*3/uL (ref 0.0–0.7)
Eosinophils Absolute: 0 10*3/uL (ref 0.0–0.7)
Eosinophils Absolute: 0.1 10*3/uL (ref 0.0–0.7)
Eosinophils Relative: 0 % (ref 0–5)
Eosinophils Relative: 0 % (ref 0–5)
Eosinophils Relative: 1 % (ref 0–5)
Lymphocytes Relative: 2 % — ABNORMAL LOW (ref 12–46)
Lymphocytes Relative: 2 % — ABNORMAL LOW (ref 12–46)
Lymphocytes Relative: 5 % — ABNORMAL LOW (ref 12–46)
Lymphocytes Relative: 9 % — ABNORMAL LOW (ref 12–46)
Lymphs Abs: 0.1 K/uL — ABNORMAL LOW (ref 0.7–4.0)
Lymphs Abs: 0.5 10*3/uL — ABNORMAL LOW (ref 0.7–4.0)
Lymphs Abs: 1 10*3/uL (ref 0.7–4.0)
Monocytes Absolute: 0.4 K/uL (ref 0.1–1.0)
Monocytes Absolute: 1.5 10*3/uL — ABNORMAL HIGH (ref 0.1–1.0)
Monocytes Relative: 5 % (ref 3–12)
Monocytes Relative: 10 % (ref 3–12)
Monocytes Relative: 5 % (ref 3–12)
Monocytes Relative: 9 % (ref 3–12)
Neutro Abs: 7.5 K/uL (ref 1.7–7.7)
Neutro Abs: 9.1 10*3/uL — ABNORMAL HIGH (ref 1.7–7.7)
Neutro Abs: 20.5 10*3/uL — ABNORMAL HIGH (ref 1.7–7.7)
Neutro Abs: 5.2 10*3/uL (ref 1.7–7.7)
Neutro Abs: 9.4 10*3/uL — ABNORMAL HIGH (ref 1.7–7.7)
Neutrophils Relative %: 92 % — ABNORMAL HIGH (ref 43–77)
Neutrophils Relative %: 94 % — ABNORMAL HIGH (ref 43–77)
Neutrophils Relative %: 82 % — ABNORMAL HIGH (ref 43–77)
Neutrophils Relative %: 86 % — ABNORMAL HIGH (ref 43–77)
Neutrophils Relative %: 93 % — ABNORMAL HIGH (ref 43–77)
WBC Morphology: INCREASED
WBC Morphology: INCREASED

## 2010-09-15 LAB — URINALYSIS, MICROSCOPIC ONLY
Glucose, UA: 100 mg/dL — AB
Ketones, ur: NEGATIVE mg/dL
Protein, ur: 100 mg/dL — AB
Urobilinogen, UA: 1 mg/dL (ref 0.0–1.0)

## 2010-09-15 LAB — CARBOXYHEMOGLOBIN
Carboxyhemoglobin: 1.5 % (ref 0.5–1.5)
O2 Saturation: 70.6 %
Total hemoglobin: 10.4 g/dL — ABNORMAL LOW (ref 12.5–16.0)

## 2010-09-15 LAB — CARDIAC PANEL(CRET KIN+CKTOT+MB+TROPI)
Relative Index: 1.7 (ref 0.0–2.5)
Relative Index: INVALID (ref 0.0–2.5)
Relative Index: INVALID (ref 0.0–2.5)
Troponin I: 0.03 ng/mL (ref 0.00–0.06)
Troponin I: 0.04 ng/mL (ref 0.00–0.06)

## 2010-09-15 LAB — MRSA PCR SCREENING

## 2010-09-15 LAB — CULTURE, RESPIRATORY W GRAM STAIN

## 2010-09-15 LAB — BRAIN NATRIURETIC PEPTIDE
Pro B Natriuretic peptide (BNP): 553 pg/mL — ABNORMAL HIGH (ref 0.0–100.0)
Pro B Natriuretic peptide (BNP): 1230 pg/mL — ABNORMAL HIGH (ref 0.0–100.0)
Pro B Natriuretic peptide (BNP): 518 pg/mL — ABNORMAL HIGH (ref 0.0–100.0)
Pro B Natriuretic peptide (BNP): 697 pg/mL — ABNORMAL HIGH (ref 0.0–100.0)

## 2010-09-15 LAB — URINE MICROSCOPIC-ADD ON

## 2010-09-15 LAB — TROPONIN I: Troponin I: 0.11 ng/mL — ABNORMAL HIGH (ref 0.00–0.06)

## 2010-09-15 LAB — CK TOTAL AND CKMB (NOT AT ARMC)
CK, MB: 1.9 ng/mL (ref 0.3–4.0)
Relative Index: 1.7 (ref 0.0–2.5)
Total CK: 110 U/L (ref 7–177)

## 2010-09-15 LAB — URINE CULTURE

## 2010-09-15 LAB — LACTIC ACID, PLASMA
Lactic Acid, Venous: 1.8 mmol/L (ref 0.5–2.2)
Lactic Acid, Venous: 2.3 mmol/L — ABNORMAL HIGH (ref 0.5–2.2)

## 2010-09-15 LAB — CORTISOL: Cortisol, Plasma: 18.6 ug/dL

## 2010-09-15 LAB — HEMOCCULT GUIAC POC 1CARD (OFFICE): Fecal Occult Bld: NEGATIVE

## 2010-09-15 LAB — APTT
aPTT: 36 s (ref 24–37)
aPTT: 45 seconds — ABNORMAL HIGH (ref 24–37)

## 2010-09-27 LAB — BASIC METABOLIC PANEL
BUN: 13 mg/dL (ref 6–23)
BUN: 13 mg/dL (ref 6–23)
BUN: 17 mg/dL (ref 6–23)
BUN: 24 mg/dL — ABNORMAL HIGH (ref 6–23)
CO2: 25 mEq/L (ref 19–32)
CO2: 26 mEq/L (ref 19–32)
CO2: 27 mEq/L (ref 19–32)
CO2: 27 mEq/L (ref 19–32)
CO2: 28 mEq/L (ref 19–32)
CO2: 29 mEq/L (ref 19–32)
CO2: 30 mEq/L (ref 19–32)
CO2: 31 mEq/L (ref 19–32)
CO2: 32 mEq/L (ref 19–32)
Calcium: 8.4 mg/dL (ref 8.4–10.5)
Calcium: 8.5 mg/dL (ref 8.4–10.5)
Calcium: 8.5 mg/dL (ref 8.4–10.5)
Calcium: 8.7 mg/dL (ref 8.4–10.5)
Calcium: 8.8 mg/dL (ref 8.4–10.5)
Calcium: 9.2 mg/dL (ref 8.4–10.5)
Calcium: 9.5 mg/dL (ref 8.4–10.5)
Chloride: 102 mEq/L (ref 96–112)
Chloride: 105 mEq/L (ref 96–112)
Chloride: 109 mEq/L (ref 96–112)
Chloride: 112 mEq/L (ref 96–112)
Creatinine, Ser: 0.78 mg/dL (ref 0.4–1.2)
Creatinine, Ser: 0.85 mg/dL (ref 0.4–1.2)
Creatinine, Ser: 0.86 mg/dL (ref 0.4–1.2)
Creatinine, Ser: 0.89 mg/dL (ref 0.4–1.2)
Creatinine, Ser: 0.92 mg/dL (ref 0.4–1.2)
Creatinine, Ser: 0.93 mg/dL (ref 0.4–1.2)
Creatinine, Ser: 0.94 mg/dL (ref 0.4–1.2)
Creatinine, Ser: 0.94 mg/dL (ref 0.4–1.2)
GFR calc Af Amer: >60 mL/min (ref >60)
GFR calc Af Amer: >60 mL/min (ref >60)
GFR calc Af Amer: >60 mL/min (ref >60)
GFR calc Af Amer: >60 mL/min (ref >60)
GFR calc Af Amer: >60 mL/min (ref >60)
GFR calc non Af Amer: >60 mL/min (ref >60)
GFR calc non Af Amer: >60 mL/min (ref >60)
Glucose, Bld: 101 mg/dL — ABNORMAL HIGH (ref 70–99)
Glucose, Bld: 85 mg/dL (ref 70–99)
Glucose, Bld: 92 mg/dL (ref 70–99)
Glucose, Bld: 95 mg/dL (ref 70–99)
Potassium: 3.9 mEq/L (ref 3.5–5.1)
Potassium: 4.1 mEq/L (ref 3.5–5.1)
Potassium: 4.3 mEq/L (ref 3.5–5.1)
Sodium: 138 mEq/L (ref 135–145)
Sodium: 139 mEq/L (ref 135–145)
Sodium: 140 mEq/L (ref 135–145)

## 2010-09-27 LAB — CBC
HCT: 33.6 % — ABNORMAL LOW (ref 36.0–46.0)
HCT: 34.3 % — ABNORMAL LOW (ref 36.0–46.0)
HCT: 34.7 % — ABNORMAL LOW (ref 36.0–46.0)
Hemoglobin: 11 g/dL — ABNORMAL LOW (ref 12.0–15.0)
Hemoglobin: 11.1 g/dL — ABNORMAL LOW (ref 12.0–15.0)
Hemoglobin: 11.4 g/dL — ABNORMAL LOW (ref 12.0–15.0)
Hemoglobin: 11.5 g/dL — ABNORMAL LOW (ref 12.0–15.0)
Hemoglobin: 11.6 g/dL — ABNORMAL LOW (ref 12.0–15.0)
MCHC: 33.1 g/dL (ref 30.0–36.0)
MCHC: 33.3 g/dL (ref 30.0–36.0)
MCHC: 33.5 g/dL (ref 30.0–36.0)
MCHC: 33.8 g/dL (ref 30.0–36.0)
MCHC: 33.9 g/dL (ref 30.0–36.0)
MCHC: 33.9 g/dL (ref 30.0–36.0)
MCHC: 34.1 g/dL (ref 30.0–36.0)
MCV: 78.8 fL (ref 78.0–100.0)
MCV: 79.1 fL (ref 78.0–100.0)
MCV: 79.3 fL (ref 78.0–100.0)
MCV: 79.4 fL (ref 78.0–100.0)
Platelets: 144 10*3/uL — ABNORMAL LOW (ref 150–400)
Platelets: 156 10*3/uL (ref 150–400)
Platelets: 162 10*3/uL (ref 150–400)
Platelets: 173 10*3/uL (ref 150–400)
Platelets: 226 10*3/uL (ref 150–400)
RBC: 4.12 MIL/uL (ref 3.87–5.11)
RBC: 4.25 MIL/uL (ref 3.87–5.11)
RBC: 4.36 MIL/uL (ref 3.87–5.11)
RBC: 4.89 MIL/uL (ref 3.87–5.11)
RDW: 18 % — ABNORMAL HIGH (ref 11.5–15.5)
RDW: 18.5 % — ABNORMAL HIGH (ref 11.5–15.5)
RDW: 18.6 % — ABNORMAL HIGH (ref 11.5–15.5)
RDW: 19.2 % — ABNORMAL HIGH (ref 11.5–15.5)
WBC: 7.1 10*3/uL (ref 4.0–10.5)
WBC: 7.6 10*3/uL (ref 4.0–10.5)

## 2010-09-27 LAB — DIFFERENTIAL
Basophils Absolute: 0 10*3/uL (ref 0.0–0.1)
Basophils Absolute: 0 10*3/uL (ref 0.0–0.1)
Basophils Absolute: 0 10*3/uL (ref 0.0–0.1)
Basophils Absolute: 0.1 10*3/uL (ref 0.0–0.1)
Basophils Relative: 0 % (ref 0–1)
Basophils Relative: 0 % (ref 0–1)
Basophils Relative: 0 % (ref 0–1)
Basophils Relative: 1 % (ref 0–1)
Basophils Relative: 1 % (ref 0–1)
Eosinophils Absolute: 0.2 10*3/uL (ref 0.0–0.7)
Eosinophils Absolute: 0.3 10*3/uL (ref 0.0–0.7)
Eosinophils Absolute: 0.3 10*3/uL (ref 0.0–0.7)
Eosinophils Absolute: 0.4 10*3/uL (ref 0.0–0.7)
Eosinophils Relative: 3 % (ref 0–5)
Eosinophils Relative: 4 % (ref 0–5)
Eosinophils Relative: 5 % (ref 0–5)
Eosinophils Relative: 5 % (ref 0–5)
Lymphocytes Relative: 11 % — ABNORMAL LOW (ref 12–46)
Lymphocytes Relative: 11 % — ABNORMAL LOW (ref 12–46)
Lymphs Abs: 0.8 10*3/uL (ref 0.7–4.0)
Lymphs Abs: 0.8 10*3/uL (ref 0.7–4.0)
Lymphs Abs: 1.3 10*3/uL (ref 0.7–4.0)
Monocytes Absolute: 0.7 10*3/uL (ref 0.1–1.0)
Monocytes Absolute: 0.8 10*3/uL (ref 0.1–1.0)
Monocytes Absolute: 0.8 10*3/uL (ref 0.1–1.0)
Monocytes Absolute: 0.8 10*3/uL (ref 0.1–1.0)
Monocytes Absolute: 0.8 10*3/uL (ref 0.1–1.0)
Monocytes Absolute: 1 10*3/uL (ref 0.1–1.0)
Monocytes Relative: 11 % (ref 3–12)
Monocytes Relative: 11 % (ref 3–12)
Monocytes Relative: 12 % (ref 3–12)
Neutro Abs: 5.4 10*3/uL (ref 1.7–7.7)
Neutro Abs: 5.5 10*3/uL (ref 1.7–7.7)
Neutro Abs: 5.8 10*3/uL (ref 1.7–7.7)
Neutro Abs: 6 10*3/uL (ref 1.7–7.7)
Neutrophils Relative %: 73 % (ref 43–77)
Neutrophils Relative %: 75 % (ref 43–77)

## 2010-09-27 LAB — CULTURE, BLOOD (ROUTINE X 2)

## 2010-09-27 LAB — BLOOD GAS, ARTERIAL
Acid-base deficit: 3.5 mmol/L — ABNORMAL HIGH (ref 0.0–2.0)
O2 Saturation: 94.5 %
Patient temperature: 37
pCO2 arterial: 44.3 mmHg (ref 35.0–45.0)

## 2010-09-27 LAB — URINALYSIS, ROUTINE W REFLEX MICROSCOPIC
Bilirubin Urine: NEGATIVE
Ketones, ur: NEGATIVE mg/dL
Nitrite: NEGATIVE
Specific Gravity, Urine: 1.025 (ref 1.005–1.030)
Urobilinogen, UA: 0.2 mg/dL (ref 0.0–1.0)

## 2010-09-27 LAB — CARDIAC PANEL(CRET KIN+CKTOT+MB+TROPI)
Relative Index: INVALID (ref 0.0–2.5)
Total CK: 28 U/L (ref 7–177)

## 2010-09-27 LAB — GLUCOSE, SEROUS FLUID: Glucose, Fluid: 119 mg/dL

## 2010-09-27 LAB — PH, BODY FLUID

## 2010-09-27 LAB — AFB CULTURE WITH SMEAR (NOT AT ARMC): Acid Fast Smear: NONE SEEN

## 2010-09-27 LAB — CROSSMATCH

## 2010-09-27 LAB — BODY FLUID CELL COUNT WITH DIFFERENTIAL
Lymphs, Fluid: 56 %
Neutrophil Count, Fluid: 4 % (ref 0–25)
Total Nucleated Cell Count, Fluid: 420 cu mm (ref 0–1000)

## 2010-09-27 LAB — PROTIME-INR
INR: 1.1 (ref 0.00–1.49)
Prothrombin Time: 14.3 seconds (ref 11.6–15.2)

## 2010-09-27 LAB — LACTATE DEHYDROGENASE, PLEURAL OR PERITONEAL FLUID

## 2010-09-28 LAB — BASIC METABOLIC PANEL
BUN: 12 mg/dL (ref 6–23)
BUN: 12 mg/dL (ref 6–23)
BUN: 21 mg/dL (ref 6–23)
BUN: 28 mg/dL — ABNORMAL HIGH (ref 6–23)
BUN: 7 mg/dL (ref 6–23)
CO2: 21 mEq/L (ref 19–32)
CO2: 21 mEq/L (ref 19–32)
CO2: 26 mEq/L (ref 19–32)
CO2: 27 mEq/L (ref 19–32)
Calcium: 8.1 mg/dL — ABNORMAL LOW (ref 8.4–10.5)
Calcium: 8.2 mg/dL — ABNORMAL LOW (ref 8.4–10.5)
Calcium: 8.4 mg/dL (ref 8.4–10.5)
Calcium: 8.7 mg/dL (ref 8.4–10.5)
Chloride: 104 mEq/L (ref 96–112)
Chloride: 107 mEq/L (ref 96–112)
Chloride: 109 mEq/L (ref 96–112)
Creatinine, Ser: 0.78 mg/dL (ref 0.4–1.2)
Creatinine, Ser: 0.82 mg/dL (ref 0.4–1.2)
Creatinine, Ser: 0.94 mg/dL (ref 0.4–1.2)
Creatinine, Ser: 1.08 mg/dL (ref 0.4–1.2)
GFR calc Af Amer: >60 mL/min (ref >60)
GFR calc Af Amer: >60 mL/min (ref >60)
GFR calc Af Amer: >60 mL/min (ref >60)
GFR calc non Af Amer: 58 mL/min — ABNORMAL LOW (ref >60)
GFR calc non Af Amer: >60 mL/min (ref >60)
Glucose, Bld: 101 mg/dL — ABNORMAL HIGH (ref 70–99)
Glucose, Bld: 95 mg/dL (ref 70–99)
Glucose, Bld: 98 mg/dL (ref 70–99)
Potassium: 3.1 mEq/L — ABNORMAL LOW (ref 3.5–5.1)
Potassium: 3.4 mEq/L — ABNORMAL LOW (ref 3.5–5.1)
Potassium: 3.6 mEq/L (ref 3.5–5.1)
Sodium: 137 mEq/L (ref 135–145)
Sodium: 139 mEq/L (ref 135–145)
Sodium: 141 mEq/L (ref 135–145)

## 2010-09-28 LAB — CBC
HCT: 26.3 % — ABNORMAL LOW (ref 36.0–46.0)
HCT: 26.7 % — ABNORMAL LOW (ref 36.0–46.0)
HCT: 26.8 % — ABNORMAL LOW (ref 36.0–46.0)
Hemoglobin: 5.7 g/dL — CL (ref 12.0–15.0)
Hemoglobin: 8.9 g/dL — ABNORMAL LOW (ref 12.0–15.0)
Hemoglobin: 9 g/dL — ABNORMAL LOW (ref 12.0–15.0)
Hemoglobin: 9.2 g/dL — ABNORMAL LOW (ref 12.0–15.0)
MCHC: 33.4 g/dL (ref 30.0–36.0)
MCHC: 33.9 g/dL (ref 30.0–36.0)
MCHC: 34 g/dL (ref 30.0–36.0)
MCHC: 34.2 g/dL (ref 30.0–36.0)
MCHC: 34.4 g/dL (ref 30.0–36.0)
MCV: 76.7 fL — ABNORMAL LOW (ref 78.0–100.0)
MCV: 76.9 fL — ABNORMAL LOW (ref 78.0–100.0)
MCV: 77.6 fL — ABNORMAL LOW (ref 78.0–100.0)
Platelets: 177 10*3/uL (ref 150–400)
Platelets: 187 10*3/uL (ref 150–400)
Platelets: 242 10*3/uL (ref 150–400)
RBC: 3.43 MIL/uL — ABNORMAL LOW (ref 3.87–5.11)
RBC: 3.46 MIL/uL — ABNORMAL LOW (ref 3.87–5.11)
RBC: 3.48 MIL/uL — ABNORMAL LOW (ref 3.87–5.11)
RBC: 3.48 MIL/uL — ABNORMAL LOW (ref 3.87–5.11)
RBC: 3.72 MIL/uL — ABNORMAL LOW (ref 3.87–5.11)
RDW: 18 % — ABNORMAL HIGH (ref 11.5–15.5)
RDW: 18.1 % — ABNORMAL HIGH (ref 11.5–15.5)
RDW: 18.5 % — ABNORMAL HIGH (ref 11.5–15.5)
RDW: 18.5 % — ABNORMAL HIGH (ref 11.5–15.5)
RDW: 19 % — ABNORMAL HIGH (ref 11.5–15.5)
WBC: 6.5 10*3/uL (ref 4.0–10.5)
WBC: 7.6 10*3/uL (ref 4.0–10.5)

## 2010-09-28 LAB — DIFFERENTIAL
Basophils Absolute: 0 10*3/uL (ref 0.0–0.1)
Basophils Absolute: 0 10*3/uL (ref 0.0–0.1)
Basophils Absolute: 0 10*3/uL (ref 0.0–0.1)
Basophils Absolute: 0 10*3/uL (ref 0.0–0.1)
Basophils Absolute: 0.2 10*3/uL — ABNORMAL HIGH (ref 0.0–0.1)
Basophils Relative: 0 % (ref 0–1)
Basophils Relative: 0 % (ref 0–1)
Basophils Relative: 0 % (ref 0–1)
Basophils Relative: 1 % (ref 0–1)
Basophils Relative: 2 % — ABNORMAL HIGH (ref 0–1)
Eosinophils Absolute: 0.2 10*3/uL (ref 0.0–0.7)
Eosinophils Absolute: 0.3 10*3/uL (ref 0.0–0.7)
Eosinophils Absolute: 0.5 10*3/uL (ref 0.0–0.7)
Eosinophils Relative: 6 % — ABNORMAL HIGH (ref 0–5)
Eosinophils Relative: 7 % — ABNORMAL HIGH (ref 0–5)
Lymphocytes Relative: 8 % — ABNORMAL LOW (ref 12–46)
Lymphocytes Relative: 9 % — ABNORMAL LOW (ref 12–46)
Lymphocytes Relative: 9 % — ABNORMAL LOW (ref 12–46)
Lymphs Abs: 0.3 10*3/uL — ABNORMAL LOW (ref 0.7–4.0)
Lymphs Abs: 0.6 10*3/uL — ABNORMAL LOW (ref 0.7–4.0)
Lymphs Abs: 0.7 10*3/uL (ref 0.7–4.0)
Monocytes Absolute: 0.7 10*3/uL (ref 0.1–1.0)
Monocytes Absolute: 0.8 10*3/uL (ref 0.1–1.0)
Monocytes Absolute: 1 10*3/uL (ref 0.1–1.0)
Monocytes Relative: 10 % (ref 3–12)
Monocytes Relative: 10 % (ref 3–12)
Monocytes Relative: 8 % (ref 3–12)
Monocytes Relative: 8 % (ref 3–12)
Monocytes Relative: 9 % (ref 3–12)
Neutro Abs: 5.8 10*3/uL (ref 1.7–7.7)
Neutro Abs: 5.8 10*3/uL (ref 1.7–7.7)
Neutro Abs: 6.7 10*3/uL (ref 1.7–7.7)
Neutro Abs: 8.2 10*3/uL — ABNORMAL HIGH (ref 1.7–7.7)
Neutro Abs: 8.3 10*3/uL — ABNORMAL HIGH (ref 1.7–7.7)
Neutro Abs: 8.9 10*3/uL — ABNORMAL HIGH (ref 1.7–7.7)
Neutrophils Relative %: 74 % (ref 43–77)
Neutrophils Relative %: 78 % — ABNORMAL HIGH (ref 43–77)
Neutrophils Relative %: 79 % — ABNORMAL HIGH (ref 43–77)
Neutrophils Relative %: 82 % — ABNORMAL HIGH (ref 43–77)
Neutrophils Relative %: 85 % — ABNORMAL HIGH (ref 43–77)

## 2010-09-28 LAB — BRAIN NATRIURETIC PEPTIDE
Pro B Natriuretic peptide (BNP): 107 pg/mL — ABNORMAL HIGH (ref 0.0–100.0)
Pro B Natriuretic peptide (BNP): 196 pg/mL — ABNORMAL HIGH (ref 0.0–100.0)
Pro B Natriuretic peptide (BNP): 347 pg/mL — ABNORMAL HIGH (ref 0.0–100.0)

## 2010-09-28 LAB — CROSSMATCH
ABO/RH(D): O POS
Antibody Screen: NEGATIVE

## 2010-09-28 LAB — EXPECTORATED SPUTUM ASSESSMENT W GRAM STAIN, RFLX TO RESP C

## 2010-09-28 LAB — COMPREHENSIVE METABOLIC PANEL
ALT: 11 U/L (ref 0–35)
BUN: 19 mg/dL (ref 6–23)
CO2: 25 mEq/L (ref 19–32)
Calcium: 8.2 mg/dL — ABNORMAL LOW (ref 8.4–10.5)
GFR calc non Af Amer: >60 mL/min (ref >60)
Glucose, Bld: 113 mg/dL — ABNORMAL HIGH (ref 70–99)
Sodium: 139 mEq/L (ref 135–145)
Total Protein: 5.5 g/dL — ABNORMAL LOW (ref 6.0–8.3)

## 2010-09-28 LAB — MAGNESIUM: Magnesium: 2.3 mg/dL (ref 1.5–2.5)

## 2010-09-28 LAB — PROTIME-INR
INR: 1.2 (ref 0.00–1.49)
Prothrombin Time: 15.7 seconds — ABNORMAL HIGH (ref 11.6–15.2)

## 2010-09-28 LAB — CULTURE, BLOOD (ROUTINE X 2): Report Status: 7292010

## 2010-09-28 LAB — IRON: Iron: <10 ug/dL — ABNORMAL LOW (ref 42–135)

## 2010-09-28 LAB — VITAMIN B12: Vitamin B-12: 192 pg/mL — ABNORMAL LOW (ref 211–911)

## 2010-09-28 LAB — HEPATIC FUNCTION PANEL
AST: 18 U/L (ref 0–37)
Bilirubin, Direct: 0.2 mg/dL (ref 0.0–0.3)
Indirect Bilirubin: 0.9 mg/dL (ref 0.3–0.9)

## 2010-09-28 LAB — CARDIAC PANEL(CRET KIN+CKTOT+MB+TROPI)
CK, MB: 2.4 ng/mL (ref 0.3–4.0)
Relative Index: INVALID (ref 0.0–2.5)
Total CK: 79 U/L (ref 7–177)
Troponin I: 0.04 ng/mL (ref 0.00–0.06)

## 2010-09-28 LAB — ABO/RH: ABO/RH(D): O POS

## 2010-09-28 LAB — FERRITIN: Ferritin: 14 ng/mL (ref 10–291)

## 2010-09-28 LAB — D-DIMER, QUANTITATIVE: D-Dimer, Quant: 0.8 ug/mL-FEU — ABNORMAL HIGH (ref 0.00–0.48)

## 2010-09-28 LAB — HEMOGLOBIN AND HEMATOCRIT, BLOOD: Hemoglobin: 8.3 g/dL — ABNORMAL LOW (ref 12.0–15.0)

## 2010-09-28 LAB — PHOSPHORUS: Phosphorus: 2.8 mg/dL (ref 2.3–4.6)

## 2010-10-24 ENCOUNTER — Other Ambulatory Visit: Payer: Self-pay

## 2010-10-24 MED ORDER — AMIODARONE HCL 200 MG PO TABS: 200.0000 mg | ORAL_TABLET | Freq: Every day | ORAL | Status: DC

## 2010-11-04 NOTE — Consult Note (Signed)
NAME:  [name], [name]              ACCOUNT NO.:  [id number]   MEDICAL RECORD NO.:  [id number]          PATIENT TYPE:  INP   LOCATION:  A339                          FACILITY:  [facility]   PHYSICIAN:  R. Roetta Sessions, M.D. DATE OF BIRTH:  May 24, 1928   DATE OF CONSULTATION:  01/14/2009  DATE OF DISCHARGE:                                 CONSULTATION   REASON FOR CONSULTATION:  Hematochezia, profound iron deficiency anemia.   HISTORY OF PRESENT ILLNESS:  [name] [name] is a pleasant, mentally  challenged, hard of hearing 77 year old Caucasian female from [place],  [place] admitted to the hospital for progressive weakness and  dyspnea on 01/12/2009.  In the ED here she was found to be profoundly  anemic with a hemoglobin and hematocrit of 5.7 and 16.6, MCV 75.5.  She  has been transfused with packed red blood cells.  Her H and H this  morning has come up to 9.2 and 26.7.  MCV remains low at 76.7, platelet  count 221,000.  She has remained hemodynamically stable this admission.  Chest x-ray reveals bilateral basal infiltrates and she is on  antibiotics.   Her second cousin, Mr. Kathleen Argue with whom she has lived since the  1960s, provides me with most of the history and tells me that she has  been constipated for years and has taken a variety of over-the-counter  laxatives.  He tells me that 15 to 20 years ago she had a colonoscopy  and had numerous colonic polyps removed but has never had any followup.  She has had low-volume, painless hematochezia for at least the past few  years.   She does not have any odynophagia, dysphagia, early sign of reflux  symptoms, nausea, or vomiting.  She sees a cardiologist up in [place]  who apparently has noted some weight loss over the past several months  and has recommended Ensure.   No family history of any first-degree relatives with colon polyps or  colon cancer.  She does take omeprazole as an outpatient along with  meloxicam.   PAST MEDICAL HISTORY:  Significant for mental retardation, hard of  hearing, reportedly has Alzheimer's dementia and history of degenerative  joint disease, history of dyslipidemia.   PAST SURGERIES:  Cholecystectomy and distant colonoscopy.   MEDICATIONS ON ADMISSION:  Aricept, meloxicam, lovastatin, aspirin 81 mg  daily and may be taking omeprazole as an outpatient as well.   ALLERGIES:  NO KNOWN DRUG ALLERGIES.   FAMILY HISTORY:  As outlined above.   SOCIAL HISTORY:  The patient lives with her second cousin in [place] as  outlined above.  No tobacco.  No alcohol.  No illicit drugs.   REVIEW OF SYSTEMS:  No recent chest pain.  She has some shortness of  breath including at this admission.  No fever.  No chills.   PHYSICAL EXAMINATION:  An elderly lady, alert and conversant who is in  the process of getting nebulizer treatment.  She has family members  present.  Temperature 98.4, pulse 100, respiratory rate 18, blood pressure 112/49.  Conjunctivae are pale.  No scleral icterus.  Oral cavity with no  lesions.  CHEST: She has quite a bit of upper airway noise over both lung fields  and some bibasilar rhonchi present.  CARDIAC:  Regular rate and rhythm without murmur, gallop, or rub.  BREASTS:  Exam deferred.  ABDOMEN:  Flat, right upper quadrant scar, positive bowel sounds, soft,  nontender without appreciable mass or organomegaly.  EXTREMITIES: No edema.   ADDITIONAL LABORATORY DATA:  Sodium 139, potassium 3.3, chloride 106,  CO2 25, glucose 113, BUN 19, creatinine 0.90, total bilirubin 1.5,  alkaline phosphatase 39, AST 21, ALT 11, albumin 3.2, magnesium 2.3,  phosphorus 2.8.   IMPRESSION:  [name] [name] is a pleasant 77 year old lady admitted  to the hospital with profound microcytic anemia and has slowly been  proven to be iron deficient.  She has chronic low-volume hematochezia.  She does have some bilateral infiltrates on chest x-ray and is being  treated  empirically for pneumonia, although appears that some degree of  her respiratory symptoms may be related to high output heart failure  related to volume contraction.  She appears to be fairly stable from a  hemodynamic and a respiratory standpoint at this time.   I am most concerned about the etiology for her rectal bleeding in the  setting of marked iron-deficiency anemia.  Given her history of multiple  colonic polyps without surveillance would have to be most concerned  about an occult colorectal cancer as the culprit in this setting.   She also takes meloxicam which puts her at risk for peptic ulcer disease  although I think the former possibility may be more likely.   RECOMMENDATIONS:  1. Fully agree with proton pump inhibitor therapy and transfusion as      has been done.  2. She will need a colonoscopy while she is here.  However, it will      need to be put off a couple of days until her respiratory status      improves.  I told Ms. Channell and her family that if colonoscopy were      to be unrevealing, then we would subsequently perform an EGD in the      same setting.  Will make further recommendations in the very near      future.   I discussed my findings and recommendations with Dr. Ladona Ridgel at the  nurses' station today.   I would like to thank Dr. Ladona Ridgel for allowing me to see this very nice  lady this afternoon.      Jonathon Bellows, M.D.  Electronically Signed     RMR/MEDQ  D:  01/14/2009  T:  01/14/2009  Job:  [id number]

## 2010-11-04 NOTE — Procedures (Signed)
NAME:  [name] [name], [name] [name]              ACCOUNT NO.:  [id number]   MEDICAL RECORD NO.:  [id number]          PATIENT TYPE:  INP   LOCATION:  A313                          FACILITY:  [facility]   PHYSICIAN:  Kofi A. Gerilyn Pilgrim, M.D. DATE OF BIRTH:  12-27-1927   DATE OF PROCEDURE:  DATE OF DISCHARGE:                              EEG INTERPRETATION   HISTORY:  This is a 80-year lady who presents with seizures.   MEDICATIONS:  1. Aricept.  2. Zocor.  3. Ventolin.  4. Atrovent.  5. Zolpidem.  6. Multivitamin.  7. Protonix.  8. Lopressor.  9. Ceftin.  10.Zithromax.  11.Ambien.  12.Lasix.  13.Tylenol.   ANALYSIS/:  A 16-channel recording was conducted for approximately 20  minutes.  There was a low-voltage electrical cortical  background  activity of 22 Hz.  Awake and sleep activities are observed.  There were  K complexes and sleep spindles recorded in indicating stage II sleep.  There were no focal or lateralized slowing.  There was no epileptiform  discharges observed.   IMPRESSION:  A normal recording with awake and sleep states.  A single  recording does not rule out epileptic seizures.  If clinically  indicated, a sleep-deprived or prolonged recording may be useful.      Kofi A. Gerilyn Pilgrim, M.D.  Electronically Signed     KAD/MEDQ  D:  01/24/2009  T:  01/24/2009  Job:  [id number]

## 2010-11-04 NOTE — Group Therapy Note (Signed)
NAME:  [name] [name], RADMAN              ACCOUNT NO.:  [id number]   MEDICAL RECORD NO.:  [id number]          PATIENT TYPE:  INP   LOCATION:  A313                          FACILITY:  [facility]   PHYSICIAN:  Melissa L. Ladona Ridgel, MD  DATE OF BIRTH:  [birth year]   DATE OF PROCEDURE:  01/22/2009  DATE OF DISCHARGE:                                 PROGRESS NOTE   The patient was seen this morning and just prior to leaving for her  thoracentesis.   The patient is a pleasant 77 year old female who presented to the  hospital with tachycardia.  The patient was found to have significant  anemia and was transfused.  She subsequently was diagnosed with a  pneumonia which was treated adequately with antibiotic therapy which she  maintains at this time.  Her course has been complicated by a pleural  effusion.  While that was being evaluated for investigation, the patient  developed his supraventricular tachycardia.  The patient was seen and  evaluated by cardiology and started on low-dose beta blocker.  Her  pleural effusion is scheduled to be tapped today in order to determine  whether there is any correlation between it and the blood loss versus a  parapneumonic effusion related to her pneumonia.  The patient also is  having significant atelectasis related to this which may be exacerbating  her cardiac condition and therefore I have elected to do both a  diagnostic and therapeutic tap.   Clinically the patient is doing well.  She has no complaints of  shortness of breath although if you watch her, she is tachypneic and she  still is still remains with a fairly choking cough.   I reviewed the case significantly with the patient's caregiver this  morning and will attempt to get him some results either this evening or  first thing in the morning with regard to her pleural effusion.   PHYSICAL EXAMINATION:  VITAL SIGNS:  The patient's temperature is 97.5,  blood pressure 118/58, pulse 77, respirations are 18,  saturation 97% on  3 liters.  GENERAL:  This is a frail elderly female in no acute distress.  HEENT:  She is normocephalic, atraumatic.  She does have bilateral  hearing aids and very hard of hearing.  Mucous membranes are moist.  There is no discharge from the nose.  Oral lesions are not present.  No  lip lesions.  NECK:  Neck is supple.  There is no JVD, no lymphadenopathy.  Trachea is  in the midline.  LUNGS:  She has decrease at the base with no wheezes, but she does have  an occasional rhonchi that clears with cough.  CARDIOVASCULAR:  Is regular rhythm with occasional APCs.  I do not  appreciate a heave or thrill.  ABDOMEN:  Abdomen is soft, nontender, nondistended with positive bowel  sounds.  There is no hepatosplenomegaly, no guarding or rebound.  EXTREMITIES:  Show no clubbing, cyanosis or edema.  NEUROLOGICALLY:  She is awake, alert, oriented.  Cranial nerves II-XII  are intact.  power 5/5.  DTRs 2+.  Plantars downgoing.   PERTINENT LABORATORIES:  The patient is status post transfusion 2 units  packed red blood cells.  Her white count is 7.2 with a hemoglobin of  11.0, hematocrit 32.7 and platelets of 140,000, albumin is 3.0.  Sodium  is 141, potassium 3.6, chloride 109, CO2 25, BUN of 13, creatinine is  0.92, LDH was 153, protein is 5.1.   ASSESSMENT/PLAN:  This is a 77 year old female with a significant  pleural effusion which may respirations parapneumonic effusion.  The  patient's hospital course has been significant for a pneumonia which has  been treated, may represent a parapneumonic source for this pleural  effusion.  She has had SVT with elements of A fib/flutter, treated with  Cardizem and digoxin.  She subsequently had converted and now been  placed on a low-dose beta blocker.  1. Pneumonia.  She is currently maintaining antibiotic therapy and      will need to be re-evaluated at the time of discharge for continued      outpatient treatment.  Will need to follow  up on the formal pleural      fluid studies to determine whether this is transudative or exudate.      Please note that the patient did have Lasix with her blood      transfusion yesterday so there may be some element of this      confounding this data.  2. A fib/flutter versus SVT.  Patient has been placed on a low-dose      beta blocker by cardiology, will continue to monitor.  She did rule      in for myocardial infarction.  3. Severe hearing impaired.  Continue with her hearing aides.   DISPOSITION:  1. Is to home with the care of her nephew.  The patient's nephew,      [name], can be reached at the following phone numbers [phone] or cell number of [phone].  I have kept him very      closely advised of her course of care and we need to give him      advanced information regarding discharge.  2. Acute anemia.  At this point I cannot explain where the patient's      hemoglobin is trending down to.  I have not spoke with Dr.      Charlane Ferretti the patient was unavailable for evaluation if needed      secondary to thoracentesis.  If her hemoglobin trends down again, I      would recommend talking to Dr. Mariel Sleet about alternative      diagnoses.     Total time on this case was 35 minutes.      Melissa L. Ladona Ridgel, MD  Electronically Signed     MLT/MEDQ  D:  01/23/2009  T:  01/23/2009  Job:  [id number]

## 2010-11-04 NOTE — Discharge Summary (Signed)
[name] [name], FORGET NO.:  [id number]   MEDICAL RECORD NO.:  [id number]          PATIENT TYPE:  INP   LOCATION:  IC03                          FACILITY:  [facility]   PHYSICIAN:  Osvaldo Shipper, MD     DATE OF BIRTH:  Oct 08, 1927   DATE OF ADMISSION:  01/12/2009  DATE OF DISCHARGE:  08/13/2010LH                               DISCHARGE SUMMARY   Please review discharge summary dictated on August 8th as well for  details regarding the patient's presenting illness.   DISCHARGE DIAGNOSES:  1. Paroxysmal atrial fibrillation currently in sinus rhythm on      amiodarone.  2. Anemia secondary to blood loss status post transfusion, stable.  3. Seizure activity x1, not on antiepileptic treatment.  4. Mild gastritis on proton pump inhibitor.  5. Colonic polyps and diverticulosis, stable.  6. Hearing impairment with mild mental retardation, stable.  7. Pneumonia status post treatment.   SUBJECTIVE:  Briefly this patient was admitted initially for pneumonia  versus pulmonary edema.  She was also admitted for severe anemia with a  hemoglobin of between 5 and 6.  She was having black stools.  The  patient was transfused blood.  She underwent endoscopy which showed  gastritis.  She underwent colonoscopy which showed colonic polyps and  diverticulosis.  No active bleeding was noted.   The patient subsequently went into paroxysmal A Fib.  This required a  lot of management.  Initially we put her on metoprolol and then she  again had recurrence with breakthrough tachycardia.  Actually she was  ready for discharge on August 9th; however, she once again relapsed into  A Fib so we had to hold her discharge and she has been on amiodarone.  She was loaded for the past 2 days and she was switched to oral dose  today.  I discussed with Dr. Juanito Doom and he thinks the patient can be  discharged and he will follow up with the patient in a couple of weeks.   She was also treated for her  pneumonia with antibiotics and she has  completed the course.  She also had seizure activity which was a one-  time event.  Underwent EEG which did not show any epileptiform activity.  She was seen by Dr. Gerilyn Pilgrim who did not recommend any antiepileptic  treatment.   She also has a hearing impairment bilaterally and has mild mental  retardation, however, these have not been active issues at this time.   So once again, the patient remained stable.  Heart rate in the 60s.  Today she has been loaded with amiodarone and has been transitioned to  p.o.  Cardiology has cleared for charged today and she can go to a  nursing home for rehabilitation.   DISCHARGE MEDICATIONS:  As follows.  Please note that these are the  current medications she needs to be on.  Please ignore the medication  list dictated on previous summaries.  1. Nu-Iron 150 mg p.o. b.i.d.  2. Aspirin 325 mg once daily to be started on August 30th.  3. Aricept 5 mg once  daily.  4. Multivitamins 1 tablet daily.  5. Vitamin B12 1000 mcg p.o. once daily.  6. Nystatin cream to be applied to her anal and perineal area for      fungal infection 3 times a day until clearance of rash.  7. Omeprazole 20 mg p.o. b.i.d. for gastritis.  8. Potassium chloride 40 mEq once daily.  9. Simvastatin 20 mg once daily every evening.  10.Calcium plus vitamin D 1 tablet b.i.d.  11.Amiodarone 400 mg p.o. b.i.d.   DISCHARGE FOLLOWUP:  1. The patient needs to have a B-MET checked in 1 week's time.  2. Follow up with [facility], either Dr. Juanito Doom or Dr.      Dietrich Pates, in 2 weeks.  They will arrange for this appointment.  3. Follow up with With Dr. Catalina Pizza next week; Thursday or Friday      should be sufficient.   DISCHARGE DIET:  Heart-healthy diet with thin liquids.   The patient is going to [facility] for rehabilitation and then she  should be able to go back home, hopefully in a week's time.   Total time on this encounter 35  minutes.      Osvaldo Shipper, MD  Electronically Signed     GK/MEDQ  D:  02/01/2009  T:  02/01/2009  Job:  [id number]   cc:   Jesse Sans. Wall, MD, FACC  [address]  [address]  [place]  [place] [address]   Gerrit Friends. Dietrich Pates, MD, [facility]  [address]  [place], [place] [address]   Catalina Pizza, M.D.  Fax: [phone]   R. Roetta Sessions, M.D.  [address]  [place]  [place] [address]   Kassie Mends, M.D.  [address]  [place] , [place] [address]

## 2010-11-04 NOTE — Group Therapy Note (Signed)
NAME:  [name] [name], [name] [name]              ACCOUNT NO.:  [id number]   MEDICAL RECORD NO.:  [id number]          PATIENT TYPE:  INP   LOCATION:  IC07                          FACILITY:  [facility]   PHYSICIAN:  Melissa L. Ladona Ridgel, MD  DATE OF BIRTH:  Oct 14, 1927   DATE OF PROCEDURE:  01/21/2009  DATE OF DISCHARGE:                                 PROGRESS NOTE   SUBJECTIVE:  The patient is feeling very well.  She is laughing and  giggling and does not have any discomfort.  No nausea, vomiting, no  chest pain, no shortness of breath.  Overnight, she remained in normal  sinus rhythm with frequent APCs.   PHYSICAL EXAMINATION:  VITAL SIGNS:  Temperature 97.7, blood pressure  ranges from 92/41 to 121/47, heart rate 71-112 with a low of 66 at 6  a.m..  The patient's saturation is generally 99% on 3 liters, although  occasionally with exertion she will get slightly tachypneic.  GENERAL:  This is a frail, marginally nourished white female in no acute  distress.  HEENT:  She is normocephalic, atraumatic.  Pupils are equal, round, and  reactive to light.  Extraocular muscles intact.  She has anicteric  sclerae. Mucous membranes are moist.  NECK:  Supple.  There is no JVD.  No lymph nodes.  No carotid bruits.  CHEST:  Decreased but clear to auscultation in the right upper lobes  only.  Lower lobe is dull to percussion and decreased. Left lung is  clear throughout.  CARDIOVASCULAR:  Regular rate and rhythm.  Positive S1, S2.  No S3, S4.  No murmurs, rubs or gallops.  ABDOMEN:  Soft, nontender, nondistended with positive bowel sounds.  No  hepatosplenomegaly, no guarding or rebound.  EXTREMITIES:  Show no bruising or petechiae. She has no clubbing,  cyanosis or edema.  NEUROLOGIC:  Cranial nerves II-XII appear to be intact.  Power is 5/5.  DTRs are 2+.  Plantars downgoing.  The patient is extremely hard of  hearing which is the only exception to her cranial nerve exam.   PERTINENT LABORATORIES:  Sodium is  139, potassium 4.1, chloride 102, CO2  30, BUN 10, creatinine 0.76.  Her calcium is 9.4, magnesium 2.2.  Hemoglobin 11.6, hematocrit 34.5 and platelets 145.  TSH is 3.353.   ASSESSMENT AND PLAN:  This is frail 77 year old female who presented  with severe anemia and what appeared to be congestive heart failure.  Her echocardiogram showed no obvious LVH. Her ejection fraction was  within normal limits.  The patient subsequently was treated for  pneumonia and has been on a azithromycin and Rocephin for 9 days.  Clinically, the patient was noted to have an effusion on the right, and  she was, therefore, maintained in the hospital for further evaluation of  this at which time she went into supraventricular tachycardia  with  atrial fibrillation and atrial flutter.  She is currently rate  controlled back in sinus rhythm.  Cardiology has seen her and placed her  on low-dose metoprolol.  1. Pleural effusion.  We will go ahead and get a thoracentesis  tomorrow and assess whether or not there is any correlation between      that finding and her current acute onset anemia.  With regard to      chest, abdomen and pelvis CT, there is no collection noted of blood      in retroperitoneum to identify why she might have dropped her      hemoglobin.  2. Supraventricular tachycardia .  The patient will be on metoprolol      low dose and clinically monitored. Her TSH is within normal limits.  3. Loose cough likely secondary to this effusion and persistent      pneumonia.  Continue her antibiotic therapy.  4. Acute anemia.  I cannot explain where this is going.  I may talk      with Dr. Mariel Sleet in the morning to see if he has any suggestions.      We will also see what happens when the thoracentesis is done.   Total time with this patient and her caregiver, Para March, was  approximately 40 minutes.      Melissa L. Ladona Ridgel, MD  Electronically Signed     MLT/MEDQ  D:  01/21/2009  T:  01/21/2009   Job:  [id number]

## 2010-11-04 NOTE — Consult Note (Signed)
NAME:  [name] [name], [name] [name] NO.:  [id number]   MEDICAL RECORD NO.:  [id number]          PATIENT TYPE:  INP   LOCATION:  IC07                          FACILITY:  [facility]   PHYSICIAN:  Jonelle Sidle, MD DATE OF BIRTH:  14-May-1928   DATE OF CONSULTATION:  01/21/2009  DATE OF DISCHARGE:                                 CONSULTATION   PRIMARY CARDIOLOGIST:  Dr. Graciela Husbands, [facility] [place]   PRIMARY CARE PHYSICIAN:  Catalina Pizza, MD   REASON FOR CONSULTATION:  Paroxysmal atrial fibrillation and atrial  tachycardia.   HISTORY OF PRESENT ILLNESS:  77 year old Caucasian female with an  element of mental retardation and dementia admitted on January 12, 2009,  with severe anemia, hemoglobin of 5.5, also pneumonia and volume  overload.  The patient had an episode of paroxysmal atrial tachycardia  and fibrillation with heart rate of 156 beats per minute on January 19, 2009.  The patient had been stabilized prior to this and treated for  pneumonia, had been given 3 units of packed red blood cells and had  become much better and was planned on going home on January 19, 2009.  However, chest x-ray revealed a partially loculated right pleural  effusion and therefore the hospitalist asked that the patient remains.  During that time the patient had a sudden onset of rapid heart rate with  rate of 156 beats per minute.  She was given IV Lopressor 5 mg IV and  started on a Cardizem drip.  However, she did not tolerate this  secondary to hypotension.  She was given IV digoxin 0.5 mg IV and then  plan for IV loading; however, the patient converted back to normal sinus  rhythm and digoxin was discontinued.  Her hemoglobin on followup labs  revealed a level of 7.7 and she was given an additional 1 unit of packed  red blood cells.  We were asked to evaluate her further.  The patient  currently is without complaints of chest pain or shortness of breath.   REVIEW OF SYSTEMS:  Positive for vertigo,  shortness of breath, dyspnea  on exertion, weakness, and hemoccult positive stool.  All other systems  have been reviewed and were found to be negative.   PAST MEDICAL HISTORY:  Mental retardation, Alzheimer disease,  dyslipidemia, degenerative joint disease, unknown cardiac history.  Cardiac record are pending at this time.   PROCEDURES DURING HOSPITALIZATION:  She did have colonoscopy and EGD  revealing no acute bleeding.  Diverticulosis was noted.  She had an  echocardiogram revealing an EF of 55-60% with diastolic dysfunction.   PAST SURGICAL HISTORY:  None per family.   SOCIAL HISTORY:  She currently lives with family.  She has never been  employed.  She has never been married.  No tobacco, no EtOH, and no drug  use.   FAMILY HISTORY:  Unknown.   CURRENT MEDICATIONS:  1. Aricept 5 mg daily.  2. Zocor 20 mg daily.  3. Multivitamin daily.  4. Ventolin Inhaler q.8 h.  5. Atrovent 0.5 mg inhaler q.8 h.  6. Xopenex 0.63 q.4 h. p.r.n.  7. Protonix 40 mg a day.  8. Rocephin 1 g q.24 h.   ALLERGIES:  No known drug allergies.   CURRENT LABS:  Sodium 139, potassium 4.1, chloride 102, CO2 of 30, BUN  10, creatinine 0.76, and glucose 82.  Hemoglobin 11.6, hematocrit 34.3,  white blood cells 7.3, platelets 125.  TSH 3.353.  BNP 347, CK 79, MB  2.4, troponin 0.04, magnesium 2.2.  CT of the chest reveals bilateral  pleural effusions.  Chest x-ray on January 16, 2009, revealed bilateral  pleural effusions, right pleural effusion partially loculated.  EKG  revealing normal sinus rhythm with septal Q-waves, rate of 79 beats per  minute.   PHYSICAL EXAMINATION:  VITAL SIGNS:  Blood pressure 116/52, pulse 99,  respirations 22, temperature 98, and O2 sat 98% on 2 liters.  GENERAL:  She is awake, alert, and oriented, very hard of hearing.  HEENT:  Head is normocephalic and atraumatic.  Eyes, PERRLA.  Mucous  membranes of mouth pink and moist.  Tongue is midline.  NECK:  Supple without JVD.   No carotid bruits appreciated.  CARDIOVASCULAR:  Regular rate and rhythm with occasional extrasystole,  without loud murmurs, rubs, or gallops.  LUNGS:  Clear to auscultation essentially.  Very diminished in the right  lower lobe.  SKIN:  Pale, dry, and warm.  ABDOMEN:  Soft and nontender with 2+ bowel sounds.  EXTREMITIES:  Without clubbing, cyanosis, or edema.  NEUROLOGIC:  Cranial nerves II through XII were grossly intact with the  exception of being very hard of hearing.   IMPRESSION:  1. Paroxysmal atrial fibrillation/tachycardia, spontaneously converted      to sinus rhythm.  2. Presentation with severe anemia, status post 4 units of packed red      blood cells.  Work-up per primary team.  3. Pneumonia with residual right lower lobe collapse and small      bilateral pleural effusions.  Work-up per primary team.   PLAN:  The patient has been seen and examined by myself and Dr. Nona Dell.  The patient will be started on low-dose beta-blocker with  parameters (Lopressor 12.5 mg b.i.d).  We have requested records from  her cardiologist in [place] to evaluate any prior cardiac history.  At  this time, the patient appears stable.  Echocardiogram does not reveal  any wall motion abnormalities and overall normal systolic function with  diastolic dysfunction.  We will follow making further recommendations  throughout hospital course.   On behalf of the physicians providers of [facility]  would like to thank [facility] and Dr. Catalina Pizza for  allowing [place] to participate in the care of this patient.      Bettey Mare. Lyman Bishop, NP      Jonelle Sidle, MD  Electronically Signed    KML/MEDQ  D:  01/21/2009  T:  01/22/2009  Job:  [id number]   cc:   Catalina Pizza, M.D.  Fax: [phone]

## 2010-11-04 NOTE — Group Therapy Note (Signed)
NAME:  [name] [name], [name] [name]              ACCOUNT NO.:  [id number]   MEDICAL RECORD NO.:  [id number]          PATIENT TYPE:  INP   LOCATION:  A313                          FACILITY:  [facility]   PHYSICIAN:  Melissa L. Ladona Ridgel, MD  DATE OF BIRTH:  1927-06-24   DATE OF PROCEDURE:  01/29/2009  DATE OF DISCHARGE:                                 PROGRESS NOTE   CHIEF COMPLAINT:  GI bleeding.   SUBJECTIVE:  [name] [name] is a very pleasant 77 year old female who is  mentally retarded. She tells me today that she has no complaints.  She  has no chest pain.  She had a regular bowel movements without any  difficulties. She is not having any dysuria.  She is having minimal  cough not bringing up any sputum. Has no abdominal pain, no headaches.  In short, she has no complaints.  We are awaiting a level II pass or  approval necessary from the  [facility] before discharged to a skilled nursing  facility.   OBJECTIVE:  Vitals today temperature 97.3, pulse 93, respirations 18,  blood pressure is 104/57.  She has no new labs today.   PHYSICAL EXAMINATION:  GENERAL:  This is a thin but well-nourished 61-  year-old female who is in jovial spirits.  HEENT:  Her head is  atraumatic and normocephalic.  Eyes are anicteric with pupils that are  equal, round, reactive to light.  Nose has no nasal discharge.  No  exterior lesions.  Mouth has moist mucous membranes with no exterior  lesions.  NECK:  Supple.  Trachea is midline.  She has no signs of  lymphadenopathy or JVD.  CHEST:  Shows no accessory muscle use. LUNGS:  Her lungs clear to auscultation bilaterally.  ABDOMEN:  Abdomen is soft,  nontender, nondistended.  She has good bowel sounds.  EXTREMITIES:  Lower extremities are warm with no signs of cyanosis or edema.  She has  2+ dorsalis pedis pulses bilaterally.  SKIN:  I noticed no new skin  lesions, rashes or bruising.      [name] Police, PA      Melissa L. Ladona Ridgel, MD  Electronically Signed    MLY/MEDQ  D:   01/29/2009  T:  01/29/2009  Job:  [id number]

## 2010-11-04 NOTE — Group Therapy Note (Signed)
NAME:  [name], [name]              ACCOUNT NO.:  [id number]   MEDICAL RECORD NO.:  [id number]          PATIENT TYPE:  INP   LOCATION:  A313                          FACILITY:  [facility]   PHYSICIAN:  Kofi A. Gerilyn Pilgrim, M.D. DATE OF BIRTH:  05-30-1928   DATE OF PROCEDURE:  DATE OF DISCHARGE:                                 PROGRESS NOTE   The patient is awake and alert.  She appears to be at baseline and this  is also reported by her family.  She is severely hard of hearing, but  does follow commands and is responsive.  Temperature 98.4, pulse 87,  respirations 20, blood pressure 124/74.  EEG shows low electrical  convulsive activity, but no epileptiform discharges.   ASSESSMENT AND PLAN:  Single seizure, unprovoked.  At this time, she  does not have the diagnosis of epilepsy in the low risk of recurrence;  therefore, I would not treat her with long-term antiepileptic  medications.  This was discussed at length with the family.  If she has  another event, I would recommend repeating a full workup including labs,  EEG and imaging/MR.  At that point she will likely need seizure  medications.      Kofi A. Gerilyn Pilgrim, M.D.  Electronically Signed     KAD/MEDQ  D:  01/24/2009  T:  01/24/2009  Job:  [id number]

## 2010-11-04 NOTE — Group Therapy Note (Signed)
NAME:  [name] [name], [name] [name]              ACCOUNT NO.:  [id number]   MEDICAL RECORD NO.:  [id number]          PATIENT TYPE:  INP   LOCATION:  A313                          [facility]ILITY[facility]  [facility]   PHYSICIAN:  Melissa L. Ladona Ridgel, MD  DATE OF BIRTH:  22-Feb-1928   DATE OF PROCEDURE:  DATE OF DISCHARGE:                                 PROGRESS NOTE   ADDENDUM:   ASSESSMENT:  A 77 year old Caucasian female with mild mental retardation  with the following medical diagnoses:  1. Acute blood loss anemia from gastrointestinal bleeding.  2. Paroxysmal atrial fibrillation.  3. Pneumonia with pleural effusion.  4. Seizure activity.  5. Hearing impairment and mild mental retardation.   PLAN:  All diagnoses are quiescent and stable at this point on current  medication regimen.  Will plan for discharge to skilled nursing facility  as soon as her PSAR level 2 is obtained.   Addendum: This patient was seen and examined.  She was scheduled for  discharge to SNF but did not have a Level 2 PASAR.  Please refer to the  discharge summary which was already dictated.  Her exam remains  unchanged.   Melissa L. Ladona Ridgel, M.D.      Stephani Police, PA      Melissa L. Ladona Ridgel, MD  Electronically Signed    MLY/MEDQ  D:  01/29/2009  T:  01/29/2009  Job:  [id number]

## 2010-11-04 NOTE — H&P (Signed)
[name] [name], [name] [name]              ACCOUNT NO.:  [id number]   MEDICAL RECORD NO.:  [id number]          PATIENT TYPE:  INP   LOCATION:  IC04                          FACILITY:  [facility]   PHYSICIAN:  Margaretmary Dys, M.D.DATE OF BIRTH:  1928-01-10   DATE OF ADMISSION:  01/12/2009  DATE OF DISCHARGE:  LH                              HISTORY & PHYSICAL   ADMISSION DIAGNOSES:  1. Severe anemia.  2. Bilateral pneumonia.  3. Probable congestive heart failure from severe anemia.  4. History of blood per rectum, bright red mixed with dark blood.  5. Generalized weakness.  6. History of significant hearing difficulties and mental retardation.   CHIEF COMPLAINT:  Shortness of breath.   HISTORY OF PRESENT ILLNESS:  [name] [name] is a 77 year old female who has  mental retardation.  She is also extremely short of hearing and once  again history from her was somewhat difficult.   The patient was brought in by the family members after complaints of  shortness of breath and fatigue over the past 3 days.  The patient did  not have any cough, no fevers.  Also her eating and drinking had  markedly reduced.  The patient's shortness of  breath was worsened by  mild exertional activity and appears to be improved at rest or when she  lays down.  She denies any sputum production.  She is denies any chest  pain.  No palpitations.  She had no swelling in her feet or abdominal  pain.  He had no headache, dizziness or lightheadedness.   The patient was evaluated in the emergency room and was found to have  severe anemia with a hemoglobin of 5.7 and a hematocrit of 16.6.  The  patient's MCV was also low, suggestive of iron-deficiency or anemia of  chronic disease/chronic blood loss.  Difficult to obtain any history.  The patient has never had a colonoscopy or evaluation by a  gastroenterologist.  In the past it was assumed that the patient's  bleeding was from hemorrhoids and was never really pursued.  The  patient  reports that in the past the bleeding has occurred when she used a stool  softener to help her go to the bathroom.   REVIEW OF SYSTEMS:  A 10-point review of stents otherwise negative  except as mentioned in history of present illness above.   PAST MEDICAL HISTORY:  1. Mental retardation.  2. Significant hearing difficulties.  3. History of Alzheimer's disease.  4. History of degenerative joint disease.  5. History of dyslipidemia.   MEDICATIONS:  1. Aricept.  2. Meloxicam  3. Lovastatin  4. Aspirin 81 mg.   The doses of these medications were not available at time of  hospitalization.   ALLERGIES:  NO KNOWN DRUG ALLERGIES.   FAMILY HISTORY:  Noncontributory.   SOCIAL HISTORY:  The patient has some mental retardation.  No history of  smoking or alcohol abuse.   Will try to obtain additional information once family members are  available.   PHYSICAL EXAMINATION:  GENERAL:  The patient was conscious, alert, well-  oriented in time, place  and person.  Was in no acute distress but had  significant hearing difficulties.  The patient also has completed 2  units of packed red blood cells.  VITAL SIGNS:  Blood pressure was 106/49 with a pulse of 86, respirations  22, temperature 97.6 degrees Fahrenheit.  Oxygen saturation was 98% on  room air.  HEENT: Normocephalic, atraumatic.  Oral mucosa was dry.  No exudates  were noted.  NECK:  Supple.  No JVD or lymphadenopathy.  LUNGS:  Chest appeared slightly deformed but no rhonchi was heard.  The  patient has crackles bibasilarly.  We have discontinued her IV fluids  after she  just completed 2 units of packed red blood cells.  HEART:  S1 and S2.  Tachycardiac.  No gallops or rubs.  ABDOMEN:  Soft, nontender.  Bowel sounds positive.  EXTREMITIES:  Trace edema bilaterally.  No calf induration or tenderness  was noted.  CNS EXAM:  The patient was awake, alert, following commands without much  difficulty.   LABORATORY AND  DIAGNOSTIC DATA:  White blood count 7.2, hemoglobin was  5.7, hematocrit was 16.6, MCV was 75.5, platelet count was 232.  Neutrophils were 80%.  Sodium is 139, potassium is 4.2, chloride of 112,  CO2 was 21, glucose 98, BUN 28, creatinine was 1.08.  AST was 18, ALT of  12, total protein was 5.4.  Calcium was 8.4.  Sputum and blood cultures  have been ordered and are pending.   ASSESSMENT/PLAN:  [name] [name] is a 77 year old female who is being  admitted to the hospital with severe anemia.  Chest x-ray is suggestive  of bilateral pleural effusions and EKG showed only sinus tachycardia.  I  do suspect more that her bilateral effusions and what appears to be  infiltrate may actually be congestive heart failure from anemic heart  failure from her from her severe anemia.   PLAN:  1. The patient will be admitted to the medical floor.  2. Will place on concentrated care and telemetry.  3. Will type and cross 2 units of packed red blood cells and transfuse      each unit over 4 hours.  4. .Keep the patient's Lasix 40 mg IV in between  5. Will continue Rocephin and Zithromax and we will de-escalate      antibiotics in the next 48 hours.  Patient's sputum cultures,  She      continues to have no fever or leukocytosis.  6. Will request gastroenterology consult to see her for possible      endoscopic studies.  7. I have discontinued her Meloxicam as it can potentially cause      gastrointestinal irritation and subsequent bleeding.  8. Will obtain a PT INR on her.  9. Will add Protonix 40 mg IV every 12 hrs..   It is noted that her BUN is elevated and entirely possible that this may  be an upper GI bleed from the nonsteroidal anti-inflammatory drug use.  Hence will put her on Protonix as mentioned above and await  Gastroenterology input.  I will discuss this plan in detail with family  members once they are available.  It was very hard explaining to the  patient as she has significant hearing  difficulties.      Margaretmary Dys, M.D.  Electronically Signed     AM/MEDQ  D:  01/13/2009  T:  01/13/2009  Job:  [id number]

## 2010-11-04 NOTE — Group Therapy Note (Signed)
NAME:  [name] [name], BAYNES              ACCOUNT NO.:  [id number]   MEDICAL RECORD NO.:  [id number]          PATIENT TYPE:  INP   LOCATION:  A339                          FACILITY:  [facility]   PHYSICIAN:  Melissa L. Ladona Ridgel, MD  DATE OF BIRTH:  [birth year]   DATE OF PROCEDURE:  01/16/2009  DATE OF DISCHARGE:                                 PROGRESS NOTE   Please see the written note for additional support of this  documentation.   SUBJECTIVE:  The patient is extremely hard of hearing.  She is found  sitting in the chair, in no acute distress.  Review of her records  reveals that she is admitted for severe anemia and was diagnosed with a  pneumonia.  The patient at this time states she feels great.  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  She denies shortness of breath pain or cough.   PHYSICAL EXAMINATION:  VITAL SIGNS:  Temperature was 96.8, blood  pressures range from 89-112 over 41-47 heart rates 79 to 82, respiratory  rate 20-8, saturation 99 to 100% on room air.  She had 2 stools.  GENERAL:  This is a frail, elderly, female with bilateral hearing aids  who is in no acute distress.  HEENT:  Normocephalic, atraumatic.  Pupils equal, round, and reactive to  light.  Extraocular muscles intact.  She has anicteric sclerae.  Membranes are moist.  NECK:  Supple.  There is no JVD.  No lymph nodes.  No carotid bruits.  CHEST:  Decreased with some rhonchi at the bases, but otherwise she does  not have any wheezing.  CARDIOVASCULAR:  Regular rhythm.  Positive for  S1, S2.  No S3 or S4.  No murmurs, rubs or gallops.  ABDOMEN:  Soft,  nontender, nondistended with positive bowel sounds.  No  hepatosplenomegaly, no guarding or rebound.  EXTREMITIES:  Peripherally she has 2+ radial pulses with no edema.  NEUROLOGIC:  She is terribly hearing impaired.  She is otherwise, a very  simple person and can  follow commands.  Power appears to be 4+/5.   LABORATORY DATA:  Pertinent laboratories.  Blood cultures have been  negative.  Sodium 137, potassium 3.4, chloride 105, CO2 is 28, BUN is  14, creatinine 0.82, glucose is 95.  Her BNP is 107.  CBC white count is  7.6, hemoglobin 9.0, hematocrit 26.3 and platelets of 187.   ASSESSMENT/PLAN:  This is a 77 year old female who was admitted with  severe anemia and pneumonia.  Initially it was felt that the patient may  have some congestive heart failure related to her anemia.  However, I  cannot substantiate that diagnosis, she did have a 2-D echo on the 27th  which showed mildly reduced cavity size, left ventricular wall increased  with mild LVH and she had EF of 55-60%.  There appear to be a grade 1  diastolic dysfunction.  There also appear to be increase in left atrial  pressure based on balling of the septum.  1. Bilateral pneumonia.  X-ray appears to be slightly improved,      although there is a query of some pleural effusion that may be      loculated.  Clinically the patient is not acting as if she is      refractory to treatment.  However, we may need to go on to CT her      chest if this persists on x-ray.  Currently, she remains on      ceftriaxone and azithromycin which we will continue; she is day 3      of that.  2. Severe anemia status post blood transfusion.  She is continuing to      hold her hemoglobin well without any evidence for hematochezia.  GI      is involved and plans to potentially scope her on Friday if her      pneumonia is improved.  3. Diastolic dysfunction may account for her pleural effusion versus a      parapneumonic effusion.  Will repeat her x-ray on Friday and      determine whether she needs CT scan or possible thoracentesis for      diagnostic purposes.  4. Significant hearing impairment.  She has her bilateral hearing      aids.  5. Dyslipidemia.  Will continue her statin medications.  6. Dementia  with mild mental retardation, will continue her Aricept.   Total time with this case was 20 minutes.      Melissa L. Ladona Ridgel, MD  Electronically Signed     MLT/MEDQ  D:  01/18/2009  T:  01/18/2009  Job:  [id number]

## 2010-11-04 NOTE — Group Therapy Note (Signed)
NAME:  [name] [name], [name] [name]              ACCOUNT NO.:  [id number]   MEDICAL RECORD NO.:  [id number]          PATIENT TYPE:  INP   LOCATION:  A339                          FACILITY:  [facility]   PHYSICIAN:  Melissa L. Ladona Ridgel, MD  DATE OF BIRTH:  04-Nov-1927   DATE OF PROCEDURE:  01/17/2009  DATE OF DISCHARGE:                                 PROGRESS NOTE   Today's date is January 17, 2009.   CHIEF COMPLAINT:  Shortness of breath and severe anemia.   SUBJECTIVE:  [name] now tells me that she is having brown bowel  movements.  She was having difficulty breathing over the weekend, but is  better now.  She has got no cough.  No chest pain or shortness of  breath.  She also has no diarrhea or abdominal pain.  She does complain  of chronic constipation with bright red blood per rectum after taking a  pill to have bowel movements.      Stephani Police, PA      Melissa L. Ladona Ridgel, MD  Electronically Signed    MLY/MEDQ  D:  01/17/2009  T:  01/17/2009  Job:  [id number]

## 2010-11-04 NOTE — Consult Note (Signed)
NAME:  [name] [name], [name] [name]              ACCOUNT NO.:  [id number]   MEDICAL RECORD NO.:  [id number]         PATIENT TYPE:  PINP   LOCATION:  A313                          FACILITY:  [facility]   PHYSICIAN:  Kofi A. Gerilyn Pilgrim, M.D. DATE OF BIRTH:  [birth year]   DATE OF CONSULTATION:  01/23/2009  DATE OF DISCHARGE:                                 CONSULTATION   REASON FOR CONSULTATION:  Seizures.   This is a 77 year old white female who apparently had a baseline history  of mental retardation and hearing impairment.  She was brought since in  the hospital about 14 days ago for 3-day history of shortness of breath  and fatigue.  She has had significant anemia with hemoglobin of 5, MCV  was low suggestive of iron-deficiency anemia or chronic disease.  The  patient's hospitalization has been complicated by the development of  paroxysmal atrial fibrillation for which she was seen Cardiology.  They  did place the patient on a beta-blocker a day or so ago.  The patient  developed a new onset tonic-clonic seizure witnessed by the nursing  staff, characterized by typical generalized tonic-clonic activity  lasting for 30 seconds.  These witnessed by the nurse associated with  postictal lethargy and confusion.  The patient was given Ativan 2 mg.  She is continues to remain out of it after the medication.  This is  approximately 2 hours ago.  No recurrent spells are noted.   PAST MEDICAL HISTORY:  Significant for hearing impairment, mental  retardation, Alzheimer dementia, dyslipidemia, degenerative joint  disease, severe iron-deficiency anemia, bilateral pneumonia during this  hospitalization, history of rectal bleed by red blood on this  hospitalization, and history of paroxysmal atrial fibrillation.   ADMISSION MEDICATIONS:  1. Aricept.  2. Mobic.  3. Lovastatin.  4. Aspirin 81 mg.   ALLERGIES:  None known.   FAMILY HISTORY:  No history of seizures, otherwise unrevealing.   SOCIAL HISTORY:  She  has supportive family and no history of tobacco or  alcohol use.   PHYSICAL EXAMINATION:  VITAL SIGNS:  Temperature 97.1, pulse 66,  respirations 20, and blood pressure 110/55.  HEENT:  The patient's head shows a large ears.  Head is otherwise  normocephalic, atraumatic.  No other dysmorphic features are noted.  NECK:  Supple.  ABDOMEN:  Soft.  EXTREMITIES:  No significant edema.  MENTATION:  The patient lays in bed with eyes partially opened.  She  responds to light sternal rub and response doing okay.  She does follow  commands with much prompting.  She follows commands bilaterally.  This  is simple commands, however.  Speech is somewhat slurred and words are  often times incomprehensible.  CRANIAL NERVE EVALUATION:  She has some mild anisocoria with the right  pupil being 4.5, left 4, both are reactive.  Extraocular movements are  full.  Facial and muscle strength shows flattening of the nasolabial  fold on the right.  Tongue appears midline.  No significant trauma noted  to the tongue.  MOTOR EXAMINATION:  She has antigravity strength in all extremities  except the left lower extremity.  Reflexes are preserved.  Plantar  reflexes are both downgoing.  Coordination shows no dysmetria.  There is  no tremors.   LABORATORY EVALUATIONS:  Sodium 140, potassium 3.4, chloride 105, CO2  31, BUN 12, creatinine 0.8, glucose 95, and calcium 8.7.  Arterial blood  gas; pH 7.31, pCO2 of 44, pO2 78, WBC 12, hemoglobin 12.9, and platelet  count 173.  Head CT scan shows global atrophy and mild periventricular  ischemic white matter changes.  There is opacification of the right  maxillary sinus nothing acute is observed.   IMPRESSION:  1. Single unprovoked seizures.  The patient has altered mentation, now      likely due to the seizure and postictal lethargy.  She also was      given Ativan, which explained the altered mentation actually this      improved with time.  The patient only has had 1  event and therefore      she does not meet the criteria for having epilepsy.  2. Baseline cognitive impairment/mental retardation.  3. Alzheimer dementia.   RECOMMENDATIONS:  I agree with Ativan that has been ordered so far.  No  need for anti-epileptic medications long-term at least at this juncture.  An EEG has been ordered.   Thanks for this consultation.      Kofi A. Gerilyn Pilgrim, M.D.  Electronically Signed     KAD/MEDQ  D:  01/23/2009  T:  01/24/2009  Job:  [id number]

## 2010-11-04 NOTE — Group Therapy Note (Signed)
NAME:  [name] [name], [name] [name]              ACCOUNT NO.:  [id number]   MEDICAL RECORD NO.:  [id number]          PATIENT TYPE:  INP   LOCATION:  A339                          FACILITY:  [facility]   PHYSICIAN:  Melissa L. Ladona Ridgel, MD  DATE OF BIRTH:  09/14/1927   DATE OF PROCEDURE:  01/18/2009  DATE OF DISCHARGE:                                 PROGRESS NOTE   OBJECTIVE:  The patient remains pleasantly altered in her mental status.  She is very hard of hearing and has an element of mental retardation and  therefore she is perpetually happy.  She states she does not know how  she is but, when you ask her about specific things, she denies shortness  of breath, abdominal pain.  Her cousin, Para March, and his spouse are at  the bedside.  The patient is pending evaluation by colonoscopy this  morning.  Of note, there were no problems overnight.   CURRENT VITAL SIGNS:  Reveal a T-max of 97.5, currently 98, blood  pressure 111/60, pulse 87, respirations 18, saturation 90%-98%.  Intake  and output is not computed.  She has however had four stools and several  voids on the day prior to this evaluation.  GENERAL:  This is a frail elderly female in no acute distress.  She is  very pleasant to be around as she mostly giggles and laughs.  She is  really oriented to time, place or person and she is incredibly hard of  hearing despite bilateral hearing aids.  HEENT:  Normocephalic, atraumatic.  Pupils equal, round and reactive to  light.  Extraocular muscles appear to be intact.  Mucous membranes are  moist.  NECK:  Supple.  There is no JVD.  No lymph nodes.  No carotid bruits.  CHEST:  Decreased but clear to auscultation.  She does have some  improved air entry at the bases.  She is not wheezing.  CARDIOVASCULAR:  Regular rate and rhythm.  Positive S1, S2.  I do not appreciate a  murmur, rub or gallop.  ABDOMEN:  Soft, nontender, nondistended with positive bowel sounds.  There is no hepatosplenomegaly.  No  guarding.  No rebound.  EXTREMITIES:  She has no peripheral edema, 2+ radial pulses.  NEUROLOGICAL:  She is essentially deaf despite bilateral hearing aids.  She is able to follow some commands.  Cranial nerves II-XII appear to be  intact.  Power appears to be 4/5.  She is able to assist and stand and  move to the bedside.   PERTINENT LABORATORIES:  Reveal a sodium of 138, potassium 3.3, chloride  106, CO2 27, BUN is 7, creatinine is 0.78.   ASSESSMENT/PLAN:  This is a 77 year old white female who presented to  the hospital with severe anemia found to have a pneumonia which is being  treated with antibiotic therapy.  The patient underwent blood  transfusion and is currently scheduled to undergo colonoscopy.  1. Pneumonia- continue current abx, nebulizers  2. Anemia current improved s/p transfusion.- continue to monitor  3. DVT prophylaxis SCD      Melissa L. Ladona Ridgel, MD  Electronically Signed  MLT/MEDQ  D:  01/19/2009  T:  01/19/2009  Job:  [id number]

## 2010-11-04 NOTE — Group Therapy Note (Signed)
NAME:  [name], [name]              ACCOUNT NO.:  [id number]   MEDICAL RECORD NO.:  [id number]          PATIENT TYPE:  INP   LOCATION:  A339                          FACILITY:  [facility]   PHYSICIAN:  Melissa L. Ladona Ridgel, MD  DATE OF BIRTH:  1927-07-13   DATE OF PROCEDURE:  01/17/2009  DATE OF DISCHARGE:                                 PROGRESS NOTE   CONTINUATION:   SUBJECTIVE:  Finally, the patient tells me that she was having  difficulty swallowing, however, this seems to have resolved somewhat.   OBJECTIVE:  Vital signs today, temperature 98.0, pulse 79, respirations  18, blood pressure is 112/47.   LABS:  White blood cell count 6.5, hemoglobin 9.0 and stable after 2  units of packed red blood cells were given on July 25th, hematocrit  26.8, platelets 177,000.  PT 15.7, INR 1.2.  Coags were taken on July  25th and they have been completely within normal limits.  BNP was  elevated on admission at 347.  When taken yesterday July 28th, it was  almost within normal limits at 107.  The patient has no new radiological  exams today.   PHYSICAL EXAMINATION:  I found [name] [name] to be awake and appropriate in  very good spirits.  She is a 77 year old debilitated Caucasian female  who is cachectically thin in her upper body.  Her head is normocephalic  and atraumatic.  Her eyes demonstrate a small reddened area in the  conjunctiva of her right eye, other than that her conjunctivas are very  pale in color.  Her sclerae are clear.  Pupils are equal, round, and  reactive to light.  Nose shows no external lesions or nasal discharge.  Mouth shows no external lesions.  She has moist mucous membranes.  Neck  is supple.  Trachea is midline.  She has no appreciable lymphadenopathy.  As mentioned earlier chest is cachectically thin.  Heart has a regular  rate and rhythm with systolic murmur that is approximately 2/6.  She has  good dorsalis pedis pulses that are 2+ bilaterally.  Abdomen is soft,  slightly distended, nontender.  She has good bowel sounds.  Lower  extremities show no edema.  They are warm and dry with no clubbing,  cyanosis   ASSESSMENT:  This is a 77 year old female with severe anemia, acute  blood loss anemia with a history of melena.  She also has pneumonia.  She is very hard of hearing and has hearing aids.  She is a history of  mental retardation, generalized weakness and dysphagia.   PLAN:  With regards to her severe anemia and dysphagia, she has been  seen by gastroenterology and Dr. Kassie Mends is planning an upper  endoscopy +/- dilatation with a colonoscopy for tomorrow.   Further, the patient has been seen by speech therapy who recommends a  soft diet with thin liquids.  They did not see overt signs of  aspiration.  They felt that the patient might have some possibility of  esophageal stricture or dysmotility.   With regards to her pneumonia, she is on Rocephin and chronic  Zithromax  as well as chronic oxygen and appears to be improving.   Time spent on this patient approximately 30 minutes.      Stephani Police, PA      Melissa L. Ladona Ridgel, MD  Electronically Signed    MLY/MEDQ  D:  01/17/2009  T:  01/17/2009  Job:  [id number]

## 2010-11-04 NOTE — Discharge Summary (Signed)
NAMEARIKA, MAINER NO.:  [id number]   MEDICAL RECORD NO.:  [id number]          PATIENT TYPE:  INP   LOCATION:  A313                          FACILITY:  [facility]   PHYSICIAN:  Osvaldo Shipper, MD     DATE OF BIRTH:  30-Jan-1928   DATE OF ADMISSION:  01/12/2009  DATE OF DISCHARGE:  08/09/2010LH                               DISCHARGE SUMMARY   The patient's primary medical doctor is Dr. Catalina Pizza.   During this hospitalization the patient was seen by the following  consultants:  1. Dr. Kendell Bane and Dr. Cira Servant from Gastroenterology.  2. Dr. Diona Browner and the [facility]iology Team.  3. Dr. Gerilyn Pilgrim, Neurology.   Procedures undergone include:  1. Esophagogastroduodenoscopy (EGD) along with a colonoscopy and this      revealed no obvious source for anemia, mild gastritis was noted.      Too small colon polyps were noted along with moderate      diverticulosis.  2. The patient underwent an EEG, which showed no evidence for      epileptiform activity.  3. She also underwent a thoracentesis of the right pleural effusion,      which removed about 250 mL of a nonbloody pleural fluid, which was      a transudative fluid.   Imaging studies done include the following:  1. Chest x-ray which showed bilateral lower lobe pneumonia versus      pulmonary edema.  Chest x-rays were repeated which continued to      show these findings along with pleural effusion on the right side.  2. She underwent a CAT scan of her abdomen and pelvis with contrast      along with CAT scan of the chest.  This showed bilateral pleural      effusion, scoliosis, wall thickening of the esophagus, bladder, and      sigmoid diverticuli.  She underwent a CT head after her seizure,      which showed atrophy and small vessel disease without acute      findings.  Opacification of the portion of the right maxillary      sinuses visualized.   DISCHARGE DIAGNOSES:  1. Paroxysmal atrial fibrillation, currently  in sinus rhythm, not an      anticoagulation candidate.  2. Anemia, secondary to blood loss, status post transfusion, stable.  3. Seizure activity x1, not on antiepileptic treatment.  4. Mild gastritis, on endoscopy, on PPI.  5. Colonic polyps and diverticulosis.  6. Hearing impairment with mild mental retardation, stable.  7. Pneumonia, completed treatment.   BRIEF HOSPITAL COURSE:  1. Anemia.  The patient is a 77 year old Caucasian female who was      brought into the hospital for weakness and she was found to have a      hemoglobin of 5.7.  She was also found to have black stools.  Iron      deficiency was documented in the form of ferritin which was 14.      B12 was 192.  The patient was transfused PRBCs.  Her hemoglobin  came up to 9.6 and remained stable for a few days and then again      drifted down to 7.7, requiring another 2 units of blood.  She will      be supplemented with iron and she will be started on vitamin B12 as      well.  2. Possible GI bleed.  She underwent EGD and colonoscopy as discussed      above.  No clear source of bleeding was identified, though she was      noted to have gastritis.  It is possible that she has chronic GI      bleeding.  She was put on a PPI and she has done well.  3. Paroxysmal AFib.  She went into paroxysmal AFib during the course      of this hospitalization.  She required beta-blockers with that she      again had breakthrough tachyarrhythmia, which resulted in increase      in the dose of beta-blockers, and for the past 36 hours, she has      remained very well rate control and occasionally in sinus rhythm      and rate of 80.  She denied any chest pain during this event and      she remained asymptomatic.  4. Pneumonia and pleural effusions.  She was started on antibiotics.      The spectrum work which was increased.  The patient required      prolonged treatment.  She required breathing treatments, oxygen,      and she has  done well, and currently she is off antibiotics.  She      is requiring 1 L of O2.  She also had a right-sided pleural      effusion for which she underwent thoracentesis, 250 mL of nonbloody      pleural fluid was removed, sent for analysis.  This was essentially      a transudative fluid.  5. Seizure activity.  During the course of the hospitalization, the      patient also had an episode of seizure just one episode.  She does      not have any history of seizures or any epilepsy.  CT scan did not      show any abnormality, which would explain these seizure activity.      She underwent an EEG which also did not show any epileptiform      activity.  She was seen by Dr. Gerilyn Pilgrim and because this was only      one event, he recommended no treatment at this time.  This was      explained to the patient and family and we told them that there is      a possibility that she may have recurrence of the seizure and in      that case treatment will be initiated, but for now, there is no      indication for antiepileptic treatment.  6. She has a history of hearing impairment and mild mental      retardation.  She wears hearing aids and this has not been an      active issue during this admission.   Over the last 24 hours, the patient has remained stable without any  complaints.  Her heart rate has been well controlled adequately.  I  think she is getting close to the point where she should be able to go  to rehab.  She was seen by Physical Therapy, who mentioned that she will  benefit from short-term rehab at a skilled nursing facility.  I am  hoping this patient will be able to go there tomorrow, Monday, January 28, 2009.   Discharge medications are as follows.  1. Nu-Iron 150 mg once daily for 1 week then b.i.d.  2. Aspirin 325 mg once daily, to be started only on February 18, 2009.  3. Aricept 5 mg p.o. daily.  4. Metoprolol 25 mg p.o. t.i.d.  5. Multivitamins 1 tablet daily.  6. Vitamin B12  1000 mcg p.o. daily.  7. Nystatin cream to apply to her anal area for fungal infection 3      times a day until clearance of the rash.  8. Omeprazole 20 mg p.o. b.i.d. for gastritis.  9. Potassium chloride 40 mEq once daily.  10.Simvastatin 20 mg q.p.m.  11.Calcium plus vitamin D.  12.Os-Cal Plus D 1 tablet b.i.d.   FOLLOWUP:  1. With Dr. Catalina Pizza in 2 weeks.  2. With [facility]iology, Dr. Juanito Doom or Dr. Dietrich Pates in 4-6 weeks.   DIET:  She may have heart-healthy diet with thin liquids.  She underwent  a swallow test which did not reveal any aspiration.   PHYSICAL ACTIVITY:  Per rehab.      Osvaldo Shipper, MD  Electronically Signed     GK/MEDQ  D:  01/27/2009  T:  01/28/2009  Job:  [id number]   cc:   Catalina Pizza, M.D.  Fax: [phone]   Gerrit Friends. Dietrich Pates, MD, [facility]  [address]  [place], [place] [address]   Kofi A. Gerilyn Pilgrim, M.D.  Fax: [phone]   Kassie Mends, M.D.  [address]  [place] , [place] [address]

## 2010-11-04 NOTE — Op Note (Signed)
[name] [name], [name] [name]              ACCOUNT NO.:  [id number]   MEDICAL RECORD NO.:  [id number]          PATIENT TYPE:  INP   LOCATION:  A339                          FACILITY:  [facility]   PHYSICIAN:  Kassie Mends, M.D.      DATE OF BIRTH:  [birth year]   DATE OF PROCEDURE:  01/18/2009  DATE OF DISCHARGE:                               OPERATIVE REPORT   REFERRING PHYSICIAN:  Derenda Mis.   PROCEDURE:  1. Ileocolonoscopy with cold forceps polypectomy.  2. Esophagogastroduodenoscopy with cold forceps biopsy gastric and      duodenal mucosa.   INDICATION FOR EXAM:  [name] [name] is a 77 year old female who presented to  the hospital with shortness of breath.  She was known to have a  microcytic anemia with a ferritin of 14.  She also had B12 deficiency.  She is on aspirin and meloxicam as an outpatient.   FINDINGS:  1. Extremely tortuous colon.  Normal terminal ileum, approximately 10      cm visualized.  2. 3-mm sessile transverse colon polyp removed via cold forceps.  3. Slightly pedunculated 6-mm  sigmoid colon polyp removed via cold      forceps.  4. Pancolonic diverticula, most frequent in the sigmoid colon.      Otherwise no masses, inflammatory changes or AVMs seen.  5. Vascular prominence near the dentate line.  Otherwise normal      retroflex view of the rectum.  6. Normal esophagus without evidence of Barrett's mass, erosions,      ulcerations or strictures.  7. Pale gastric mucosa.  Occasional areas of patchy erythema.      Biopsies obtained via cold forceps to evaluate for H. Pylori      gastritis or atrophic gastritis.  8. Normal duodenal bulb and second portion of the duodenum with      moderate bile staining.  Biopsies obtained via cold forceps to      evaluate for celiac sprue and etiology for iron deficiency anemia.   DIAGNOSES:  1. No obvious source for microcytic anemia identified.  2. Mild gastritis.  3. Two small colon polyps.  4. Moderate diverticulosis.   RECOMMENDATIONS:  1. May restart meloxicam and aspirin on 02/18/2009.  No      anticoagulation until January 25, 2009.  2. Soft mechanical diet.  3. Protonix 40 mg daily.  4. Add Boost t.i.d..  5. Follow-up appointment with Dr. Cira Servant in 2 months and will check her      ferritin and her hemoglobin.  6. Will await biopsies.   MEDICATIONS:  1. Fentanyl 25 mcg IV.  2. Versed 3 mg IV.   PROCEDURE TECHNIQUE:  Physical exam was performed.  Inferior consent was  obtained from the patient after explaining the benefits, risks and  alternatives to the procedure.  The patient was connected to the monitor  and placed in left lateral position.  Continuous oxygen  was provided by  nasal cannula.  IV medicine administered through an indwelling cannula.  After administration of sedation and rectal exam, the patient's rectum  was intubated and the scope was advanced under  direct visualization to  the distal terminal ileum.  Scope was removed slowly by carefully  examining the color, texture, anatomy and integrity of the mucosa on the  way out.   After colonoscopy, the patient's esophagus was intubated with a  diagnostic gastroscope.  The scope was advanced under direct  visualization to the second portion of the duodenum.  The scope was  removed slowly by carefully examining the color, texture, anatomy and  integrity of the mucosa on the way out.  The patient was recovered in  endoscopy and discharged to the floor in satisfactory condition.   PATH:  Severe chronic gastritis, peptic duodenitis, and simple adenomas      Kassie Mends, M.D.  Electronically Signed     SM/MEDQ  D:  01/18/2009  T:  01/18/2009  Job:  [id number]   cc:   Catalina Pizza, M.D.  Fax: [phone]

## 2010-11-04 NOTE — Discharge Summary (Signed)
NAMECHANDNI, [name] [name]              ACCOUNT NO.:  [id number]   MEDICAL RECORD NO.:  [id number]          PATIENT TYPE:  INP   LOCATION:  A313                          FACILITY:  [facility]   PHYSICIAN:  Osvaldo Shipper, MD     DATE OF BIRTH:  [birth year]   DATE OF ADMISSION:  01/12/2009  DATE OF DISCHARGE:  LH                               DISCHARGE SUMMARY   ADDENDUM:  Discharge summary was dictated on January 27, 2009.  Addendum  is as follows:  The patient has no complaints today.  She tells me she  is ready for discharge and is excited about going.   PHYSICAL EXAMINATION:  VITAL SIGNS:  On physical exam vital signs this  morning are as follows:  Temperature 97.4, pulse 64, respirations 20,  blood pressure is 104/51, O2 saturation is 98%.  GENERAL:  The patient is alert and oriented, has energetic spirits this  morning.  She is in no apparent distress.  HEENT:  Head is normocephalic, atraumatic.  Eyes are anicteric.  Pupils  are equal, round, reactive to light.  She does have an area of redness  on her right conjunctiva.  This does not appear to be inflamed or having  drainage.  Nose shows no nasal discharge or exterior lesions.  Mouth  shows no exterior lesions.  She has moist mucous membranes.  NECK:  Neck has no apparent JVD.  She has no lymphadenopathy.  Trachea  is midline.  Neck is supple.  CHEST:  Shows no accessory muscle usage.  LUNGS:  Clear to auscultation bilaterally with no wheezes or crackles  appreciated.  HEART:  Her heart has a regular rate and rhythm this morning with no  appreciable murmurs, rubs or gallops.  EXTREMITIES:  No cyanosis or edema.  She has 2+ radial pulses  bilaterally and 2+ dorsalis pedis pulses bilaterally.  ABDOMEN:  Soft, nontender, nondistended with good bowel sounds.  PSYCHIATRIC:  The patient is awake, oriented and in good spirits.  She  is excited about leaving the hospital.   The rest of the discharge summary is per Dr. Majel Homer note  dated  January 27, 2009.      Stephani Police, [place]      Osvaldo Shipper, MD  Electronically Signed    MLY/MEDQ  D:  01/28/2009  T:  01/28/2009  Job:  [id number]

## 2010-12-18 ENCOUNTER — Other Ambulatory Visit: Payer: Self-pay

## 2010-12-18 MED ORDER — AMIODARONE HCL 200 MG PO TABS: 200.0000 mg | ORAL_TABLET | Freq: Every day | ORAL | Status: DC

## 2010-12-30 ENCOUNTER — Encounter: Payer: Self-pay | Admitting: Adult Health

## 2011-01-16 ENCOUNTER — Encounter: Payer: Self-pay | Admitting: Cardiology

## 2011-01-22 ENCOUNTER — Other Ambulatory Visit: Payer: Self-pay | Admitting: *Deleted

## 2011-01-22 ENCOUNTER — Ambulatory Visit (INDEPENDENT_AMBULATORY_CARE_PROVIDER_SITE_OTHER): Payer: Medicare Other | Admitting: Cardiology

## 2011-01-22 ENCOUNTER — Encounter: Payer: Self-pay | Admitting: Cardiology

## 2011-01-22 VITALS — BP 126/62 | HR 69 | Ht <= 58 in | Wt 95.0 lb

## 2011-01-22 DIAGNOSIS — E785 Hyperlipidemia, unspecified: Secondary | ICD-10-CM | POA: Insufficient documentation

## 2011-01-22 DIAGNOSIS — I4891 Unspecified atrial fibrillation: Secondary | ICD-10-CM

## 2011-01-22 DIAGNOSIS — E782 Mixed hyperlipidemia: Secondary | ICD-10-CM

## 2011-01-22 MED ORDER — AMIODARONE HCL 200 MG PO TABS: 200.0000 mg | ORAL_TABLET | Freq: Every day | ORAL | Status: DC

## 2011-01-22 NOTE — Progress Notes (Signed)
Clinical Summary [name] [name] is a 77 y.o.female presenting for followup. She is here with her son. She was last seen in October 2011.  She has been doing relatively well, confirmed by her son today. Denies any palpitations or chest pain. Has not had any falls. Has been compliant with medications by report.  She continues to follow regularly with Dr. Margo Aye for blood work. Results will be requested for review.   No Known Allergies  Medication list reviewed.  Past Medical History  Diagnosis Date  . Anemia     Gastritis/duodenitis  . Atrial fibrillation   . Seizure disorder   . Mental retardation   . Alzheimer's dementia   . Mixed hyperlipidemia   . Osteoarthritis   . Hx of colonic polyps   . Pneumonia 8/10  . Diverticulosis     Social History [name] [name] reports that she has never smoked. She has never used smokeless tobacco. [name] [name] reports that she does not drink alcohol.  Review of Systems Negative except as outlined.  Physical Examination Filed Vitals:   01/22/11 1304  BP: 126/62  Pulse: 69  Chronically ill appearing, elderly woman, no acute distress.  HEENT: Conjunctiva lids normal, oropharynx with poor dentition  Neck: Supple, no carotid bruits or thyromegaly.  Lungs: Clear with diminished breath sounds. Nonlabored.  Cardiac: Regular rate and rhythm, somewhat distant, no loud systolic murmur or gallop.  Extremities: No pitting edema, distal pulses one plus.  Neuropsychiatric: Alert and oriented x2.    ECG Normal sinus rhythm at 73 with leftward axis, R primes in leads V1 through V3, QRS 90 ms, QTC 508 ms.    Problem List and Plan

## 2011-01-22 NOTE — Assessment & Plan Note (Signed)
Continue followup with Dr. Margo Aye.

## 2011-01-22 NOTE — Assessment & Plan Note (Signed)
Symptomatically well controlled, maintaining sinus rhythm on amiodarone. No change to dose at this time. Will request lab results from Dr. Margo Aye.

## 2011-01-22 NOTE — Patient Instructions (Signed)
**Note De-identified  Obfuscation** Your physician recommends that you continue on your current medications as directed. Please refer to the Current Medication list given to you today.  Your physician recommends that you schedule a follow-up appointment in: 6 months  

## 2011-01-23 ENCOUNTER — Ambulatory Visit: Payer: Self-pay | Admitting: Cardiology

## 2011-03-01 IMAGING — CT CT MAXILLOFACIAL W/O CM
3 of 4 series · 16 of 47 positions shown, 19 images · non-contrast
Comparison: CT head 01/23/2009

CT HEAD

CLINICAL DATA: Fall, facial trauma and bruising

CT HEAD WITHOUT CONTRAST
CT MAXILLOFACIAL WITHOUT CONTRAST
TECHNIQUE: Multidetector CT imaging of the head and maxillofacial
structures were performed using the standard protocol without
intravenous contrast. Multiplanar CT image reconstructions of the
maxillofacial structures were also generated. Right side of the
face marked with BBs.

[Series 3: facial axial st 2.0 h20s · axial · 0.27mm/px · z∈[+21,+156]mm · 11 of 106 slices shown, 14 images]
[im 8/106  brain]
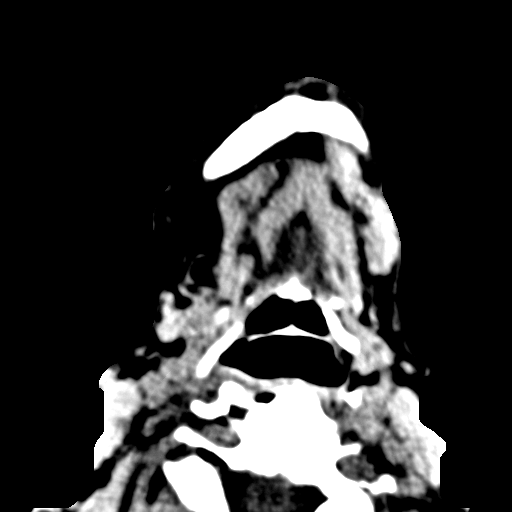
[im 8/106  bone]
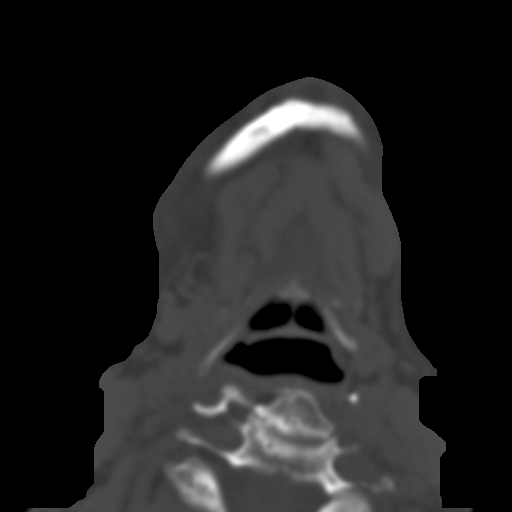
[im 15/106  bone]
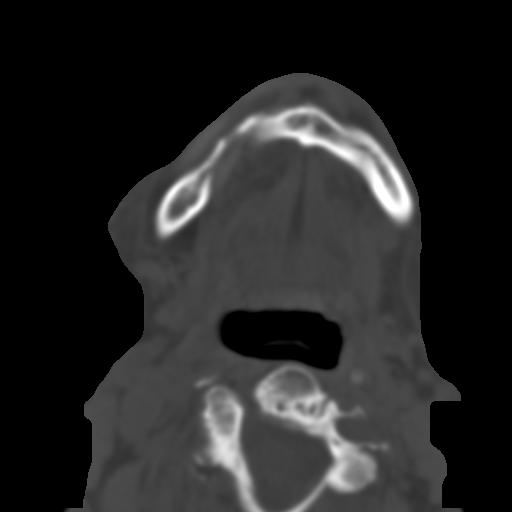
[im 26/106  bone]
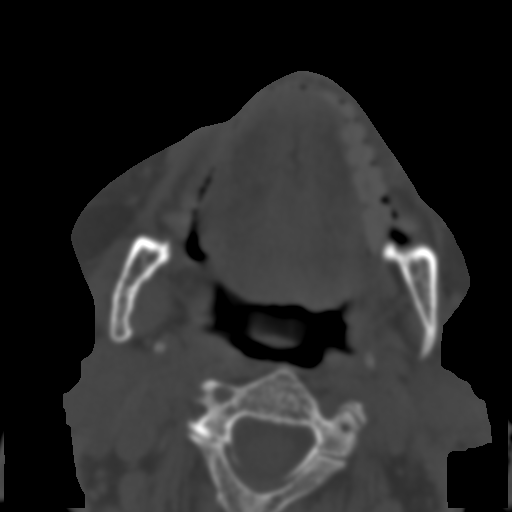
[im 33/106  bone]
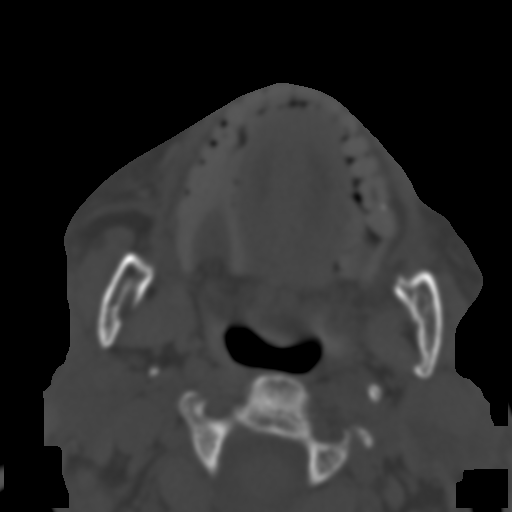
[im 44/106  brain]
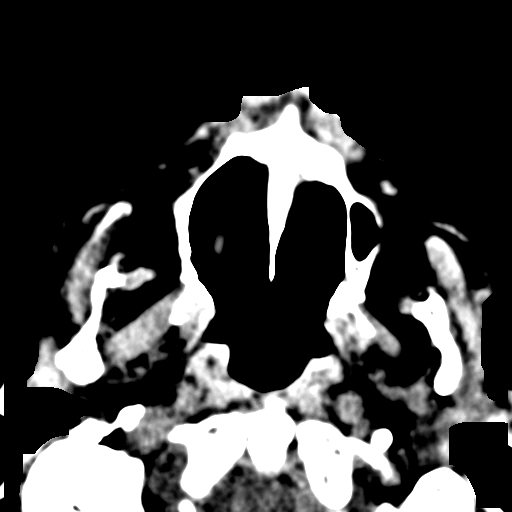
[im 44/106  bone]
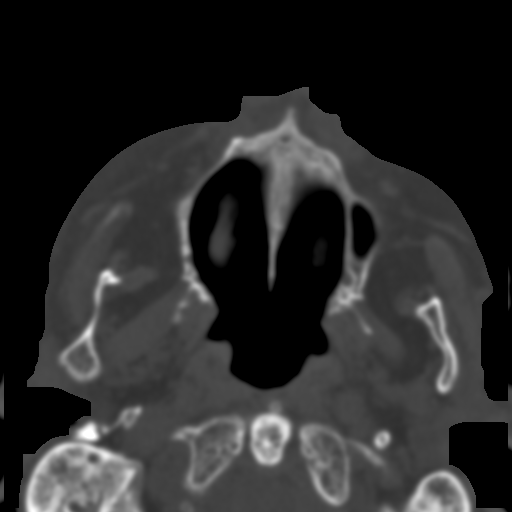
[im 55/106  bone]
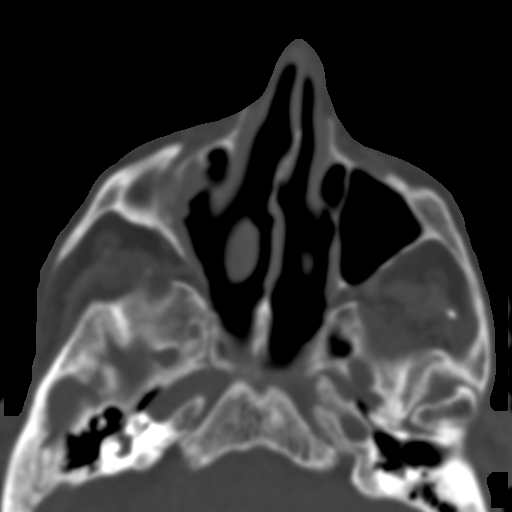
[im 62/106  bone]
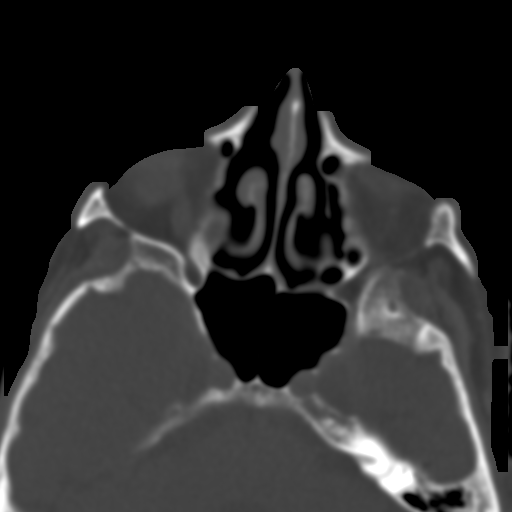
[im 73/106  bone]
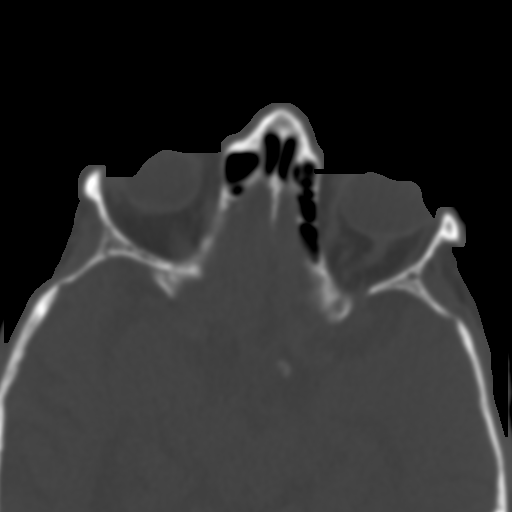
[im 80/106  brain]
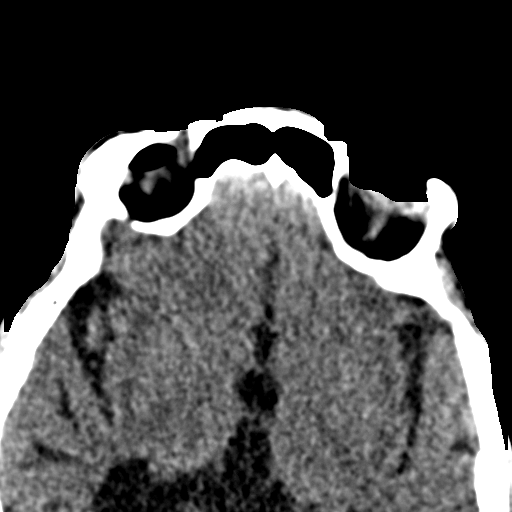
[im 80/106  bone]
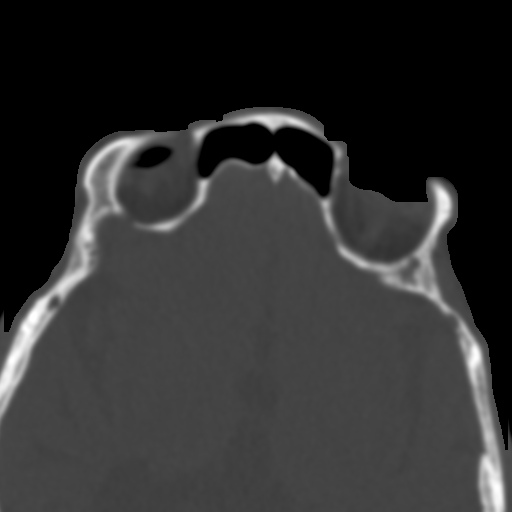
[im 91/106  bone]
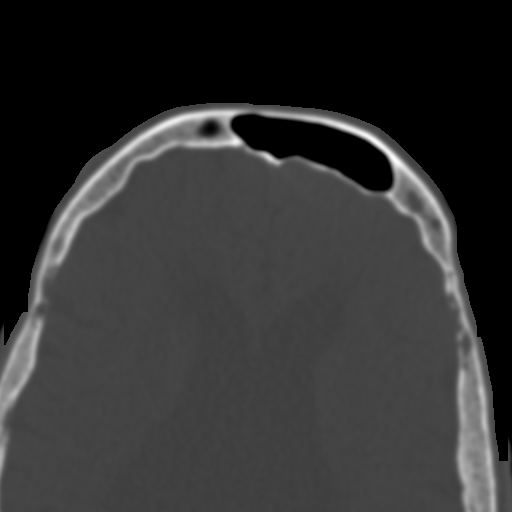
[im 98/106  bone]
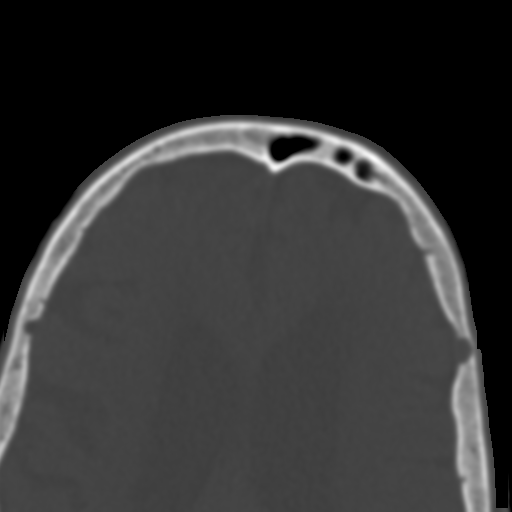

[Series 5: facial coro st 2.0 · coronal · 0.27mm/px · 3 of 90 slices shown]
[im 30/90  bone]
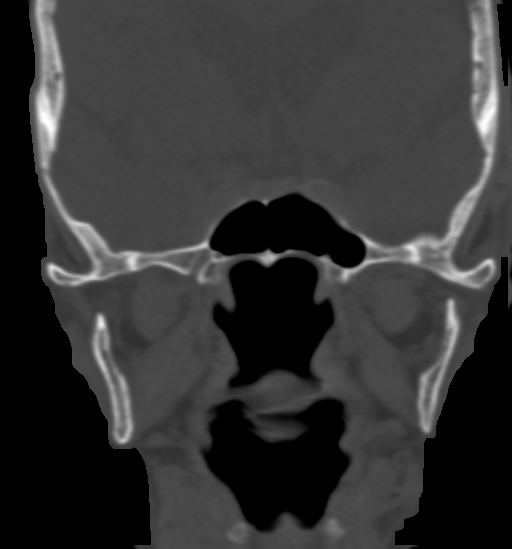
[im 40/90  bone]
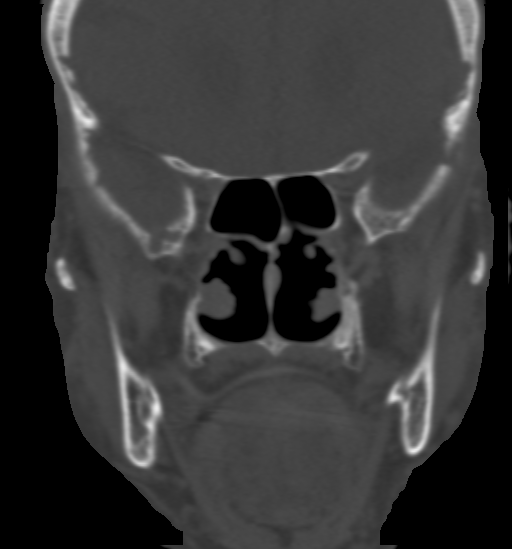
[im 50/90  bone]
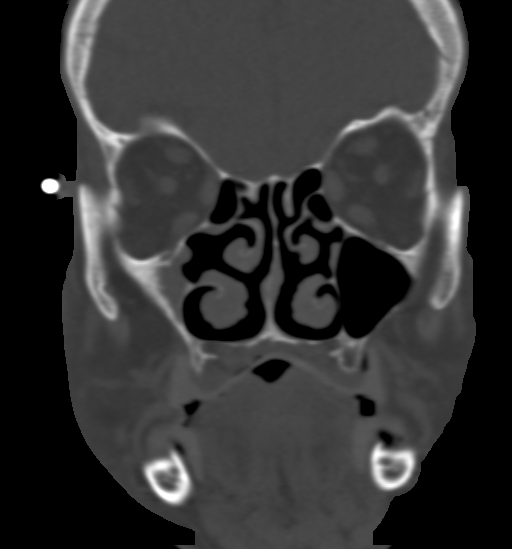

[Series 8: facial sag st 2.0 · sagittal · 0.28mm/px · 2 of 90 slices shown]
[im 30/90  bone]
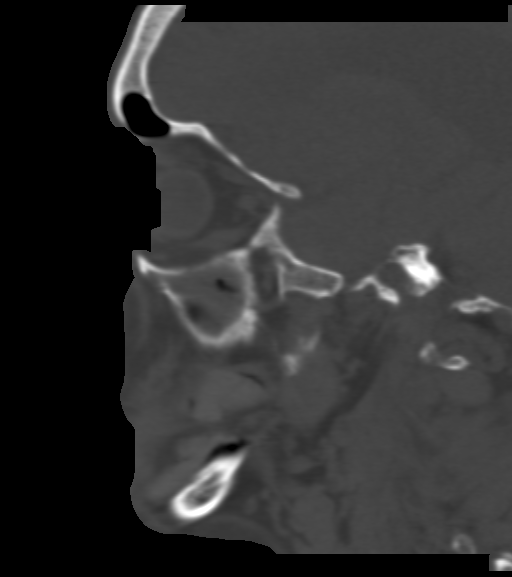
[im 60/90  bone]
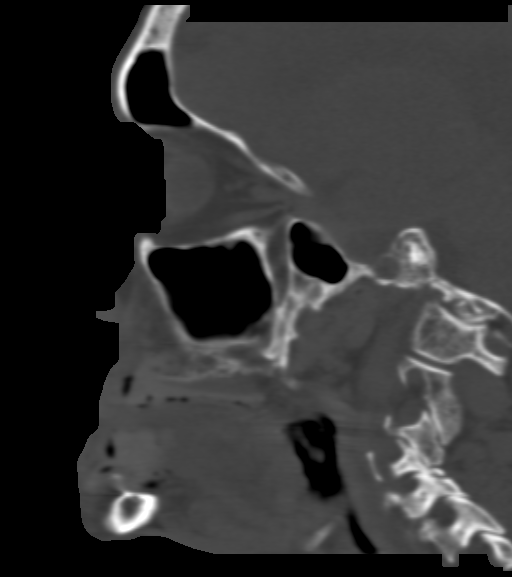

[16 of 47 positions shown; findings below may reference images not displayed]

FINDINGS: Generalized atrophy.
Normal ventricular morphology.
No midline shift or mass effect.
Small vessel chronic ischemic changes of deep cerebral white
matter.
No intracranial hemorrhage, mass lesion, or acute infarction.
Right periorbital scalp hematoma.
Bones unremarkable.
IMPRESSION: Atrophy with mild small vessel chronic ischemic changes of deep
cerebral white matter.
No acute intracranial abnormalities.

CT MAXILLOFACIAL
FINDINGS: Visualized intracranial structures unremarkable.
Right periorbital scalp hematoma extending into right temporal
region.
Hypoplastic right maxillary sinus which is opacified.
Hypoplastic right mastoid air cells as well.
No facial bone fracture identified.
Upper and lower denture plates.
Mild nasal septal deviation to the left.
No other regional abnormalities.
Intraorbital tissue planes clear.
IMPRESSION: No acute facial bony abnormalities.

## 2011-06-17 ENCOUNTER — Encounter: Payer: Self-pay | Admitting: Adult Health

## 2011-06-17 ENCOUNTER — Ambulatory Visit (HOSPITAL_COMMUNITY)
Admission: RE | Admit: 2011-06-17 | Discharge: 2011-06-17 | Disposition: A | Discharge status: Home / Self Care | Payer: Medicare Other | Source: Ambulatory Visit | Attending: Adult Health | Admitting: Adult Health

## 2011-06-17 ENCOUNTER — Ambulatory Visit (INDEPENDENT_AMBULATORY_CARE_PROVIDER_SITE_OTHER): Payer: Medicare Other | Admitting: Adult Health

## 2011-06-17 DIAGNOSIS — R05 Cough: Secondary | ICD-10-CM | POA: Insufficient documentation

## 2011-06-17 DIAGNOSIS — R059 Cough, unspecified: Secondary | ICD-10-CM

## 2011-06-17 DIAGNOSIS — I4891 Unspecified atrial fibrillation: Secondary | ICD-10-CM

## 2011-06-17 NOTE — Progress Notes (Signed)
   HPI: [name] [name] is a very pleasant, 77 y/o  mentally slow female patient of Dr. Diona Browner we are following for ongoing assessment and treatment of atrial fibrillation and hypercholesterolemia. She has had complaints of frequent coughing and congestion. She uses a child's sippy cup to drink as she has had some swallowing issues. She has a history o frequent falls and is not on coumadin. Otherwise, she denies chest pain, DOE, palpitations or dizziness. She is in good spirits.    No Known Allergies  Current Outpatient Prescriptions  Medication Sig Dispense Refill  . amiodarone (PACERONE) 200 MG tablet Take 1 tablet (200 mg total) by mouth daily.  30 tablet  6  . aspirin 81 MG EC tablet Take 81 mg by mouth daily.        . Calcium Carbonate-Vit D-Min (CALTRATE 600+D PLUS) 600-400 MG-UNIT per tablet Take 1 tablet by mouth daily.        Marland Kitchen docusate sodium (COLACE) 100 MG capsule Take 100 mg by mouth at bedtime.        . donepezil (ARICEPT) 5 MG tablet Take 5 mg by mouth daily.        . iron polysaccharides (NIFEREX) 150 MG capsule Take 150 mg by mouth 2 (two) times daily.        Marland Kitchen levETIRAcetam (KEPPRA) 500 MG tablet Take 500 mg by mouth 2 (two) times daily.        Marland Kitchen levothyroxine (SYNTHROID, LEVOTHROID) 75 MCG tablet Take 75 mcg by mouth daily.        . Multiple Vitamins-Minerals (OCUVITE PO) Take 1 tablet by mouth daily.        Marland Kitchen omeprazole (PRILOSEC) 20 MG capsule Take 20 mg by mouth daily.        . potassium chloride SA (K-DUR,KLOR-CON) 20 MEQ tablet Take 40 mEq by mouth daily.        . simvastatin (ZOCOR) 20 MG tablet Take 20 mg by mouth daily.          Past Medical History  Diagnosis Date  . Anemia     Gastritis/duodenitis  . Atrial fibrillation   . Seizure disorder   . Mental retardation   . Alzheimer's dementia   . Mixed hyperlipidemia   . Osteoarthritis   . Hx of colonic polyps   . Pneumonia 8/10  . Diverticulosis      WUJ:WJXBJY of systems complete and found to be negative  unless listed above PHYSICAL EXAM BP 144/74  Pulse 72  Wt 95 lb (43.092 kg)  General: Well developed, well nourished, in no acute distress Head: Eyes PERRLA, some redness noted in the tear duct, but no bleeding or oozing. No xanthomas.   Normal cephalic and atramatic  Lungs: Clear bilaterally to auscultation and percussion but shallow breathing. Heart: HRIR S1 S2, without MRG.  Pulses are 2+ & equal.            No carotid bruit. No JVD.  No abdominal bruits. No femoral bruits. Abdomen: Bowel sounds are positive, abdomen soft and non-tender without masses or                  Hernia's noted. Msk:  Back normal, normal gait. Normal strength and tone for age. Extremities: No clubbing, cyanosis or edema.  DP +1 Neuro: Alert and oriented X 3. Psych:  Good affect, responds appropriately :  ASSESSMENT AND PLAN

## 2011-06-17 NOTE — Progress Notes (Deleted)
Anesthesia Post Note  Patient: [name] [name]  Procedure(s) Performed: * No surgery found *  Anesthesia type: {PROCEDURES; ANE POST ANESTHESIA TYPE:19480}  Patient location: {PLACES; ANE BJYN:82956}  Post pain: {FINDINGS; ANE POST OZHY:86578}  Post assessment: {ASSESSMENT; ANE IONG:29528}  Last Vitals:  Filed Vitals:   06/17/11 1401  BP: 144/74  Pulse: 72    Post vital signs: {DESC; ANE POST VITALS:19483}  Level of consciousness: {FINDINGS; ANE POST LEVEL OF CONSCIOUSNESS:19484}  Complications: {FINDINGS; ANE POST COMPLICATIONS:19485}

## 2011-06-17 NOTE — Patient Instructions (Signed)
Your physician recommends that you schedule a follow-up appointment in: 6 months  A chest x-ray takes a picture of the organs and structures inside the chest, including the heart, lungs, and blood vessels. This test can show several things, including, whether the heart is enlarges; whether fluid is building up in the lungs; and whether pacemaker / defibrillator leads are still in place.

## 2011-06-17 NOTE — Assessment & Plan Note (Signed)
Heart rate is well controlled at present. She is doing well and will be continued on ASA only for anticoagulation.She is maintaining sinus rhythm on amiodarone. Because of frequent coughing, I will check a CXR to evaluate further.

## 2011-07-10 DIAGNOSIS — E039 Hypothyroidism, unspecified: Secondary | ICD-10-CM | POA: Diagnosis not present

## 2011-07-15 DIAGNOSIS — H353190 Nonexudative age-related macular degeneration, unspecified eye, stage unspecified: Secondary | ICD-10-CM | POA: Diagnosis not present

## 2011-09-14 ENCOUNTER — Other Ambulatory Visit: Payer: Self-pay | Admitting: *Deleted

## 2011-09-14 MED ORDER — AMIODARONE HCL 200 MG PO TABS: 200.0000 mg | ORAL_TABLET | Freq: Every day | ORAL | Status: DC

## 2011-10-30 DIAGNOSIS — K219 Gastro-esophageal reflux disease without esophagitis: Secondary | ICD-10-CM | POA: Diagnosis not present

## 2011-10-30 DIAGNOSIS — E785 Hyperlipidemia, unspecified: Secondary | ICD-10-CM | POA: Diagnosis not present

## 2011-10-30 DIAGNOSIS — E039 Hypothyroidism, unspecified: Secondary | ICD-10-CM | POA: Diagnosis not present

## 2011-11-17 ENCOUNTER — Encounter: Payer: Self-pay | Admitting: Adult Health

## 2011-11-17 ENCOUNTER — Ambulatory Visit: Payer: Medicare Other | Admitting: Adult Health

## 2011-11-17 ENCOUNTER — Ambulatory Visit (INDEPENDENT_AMBULATORY_CARE_PROVIDER_SITE_OTHER): Payer: Medicare Other | Admitting: Adult Health

## 2011-11-17 VITALS — BP 113/61 | HR 65 | Resp 16 | Ht <= 58 in | Wt 92.0 lb

## 2011-11-17 DIAGNOSIS — I4891 Unspecified atrial fibrillation: Secondary | ICD-10-CM

## 2011-11-17 DIAGNOSIS — D649 Anemia, unspecified: Secondary | ICD-10-CM | POA: Diagnosis not present

## 2011-11-17 NOTE — Patient Instructions (Signed)
Your physician recommends that you schedule a follow-up appointment in: 12

## 2011-11-17 NOTE — Assessment & Plan Note (Signed)
Heart rate is well controlled. No changes in her medications. She is doing well and is stable. Will see her in one year unless she becomes symptomatic. Refills on all medications are provided.

## 2011-11-17 NOTE — Assessment & Plan Note (Signed)
Per PCP with labs every 6 months.

## 2011-11-17 NOTE — Progress Notes (Signed)
HPI: [name] [name] is a very pleasant, 78 y/o  mentally slow female patient of Dr. Diona Browner we are following for ongoing assessment and treatment of atrial fibrillation and hypercholesterolemia. She uses a child's sippy cup to drink as she has had some swallowing issues. She has a history o frequent falls and is not on coumadin. Otherwise, she denies chest pain, DOE, palpitations or dizziness. She is in good spirits.  She expresses no complaints at this time. She is medically compliant and is cared for by family members who are very attentive.  No Known Allergies  Current Outpatient Prescriptions  Medication Sig Dispense Refill  . amiodarone (PACERONE) 200 MG tablet Take 1 tablet (200 mg total) by mouth daily.  30 tablet  6  . aspirin 81 MG EC tablet Take 81 mg by mouth daily.        . Calcium Carbonate-Vit D-Min (CALTRATE 600+D PLUS) 600-400 MG-UNIT per tablet Take 1 tablet by mouth daily.        Marland Kitchen docusate sodium (COLACE) 100 MG capsule Take 100 mg by mouth at bedtime.        . donepezil (ARICEPT) 5 MG tablet Take 5 mg by mouth daily.        . iron polysaccharides (NIFEREX) 150 MG capsule Take 150 mg by mouth 2 (two) times daily.        Marland Kitchen levETIRAcetam (KEPPRA) 500 MG tablet Take 500 mg by mouth 2 (two) times daily.        Marland Kitchen levothyroxine (SYNTHROID, LEVOTHROID) 75 MCG tablet Take 75 mcg by mouth daily.        . Multiple Vitamins-Minerals (OCUVITE PO) Take 1 tablet by mouth daily.        Marland Kitchen omeprazole (PRILOSEC) 20 MG capsule Take 20 mg by mouth daily.        . potassium chloride (K-DUR,KLOR-CON) 10 MEQ tablet Take 10 mEq by mouth 2 (two) times daily.      . simvastatin (ZOCOR) 20 MG tablet Take 20 mg by mouth daily.          Past Medical History  Diagnosis Date  . Anemia     Gastritis/duodenitis  . Atrial fibrillation   . Seizure disorder   . Mental retardation   . Alzheimer's dementia   . Mixed hyperlipidemia   . Osteoarthritis   . Hx of colonic polyps   . Pneumonia 8/10  .  Diverticulosis      ZOX:WRUEAV of systems complete and found to be negative unless listed above PHYSICAL EXAM BP 113/61  Pulse 65  Resp 16  Ht 4\' 4"  (1.321 m)  Wt 92 lb (41.731 kg)  BMI 23.92 kg/m2  General: Well developed, well nourished, in no acute distress Head: Eyes PERRLA, some redness noted in the tear duct, but no bleeding or oozing. No xanthomas.   Normal cephalic and atramatic  Lungs: Clear bilaterally to auscultation and percussion  Heart: HRIR S1 S2, without MRG.  Pulses are 2+ & equal.            No carotid bruit. No JVD.  No abdominal bruits. No femoral bruits. Abdomen: Bowel sounds are positive, abdomen soft and non-tender without masses or                  Hernia's noted. Msk:  Back normal, normal gait. Normal strength and tone for age. Extremities: No clubbing, cyanosis or edema.  DP +1 Neuro: Alert and oriented X 3.Hard of hearing Psych:  Good affect, responds  appropriately :  ASSESSMENT AND PLAN

## 2011-11-25 DIAGNOSIS — E039 Hypothyroidism, unspecified: Secondary | ICD-10-CM | POA: Diagnosis not present

## 2011-12-10 ENCOUNTER — Emergency Department (HOSPITAL_COMMUNITY): Payer: Medicare Other

## 2011-12-10 ENCOUNTER — Emergency Department (HOSPITAL_COMMUNITY)
Admission: EM | Admit: 2011-12-10 | Discharge: 2011-12-10 | Disposition: A | Discharge status: Home / Self Care | Payer: Medicare Other | Attending: Emergency Medicine | Admitting: Emergency Medicine

## 2011-12-10 ENCOUNTER — Encounter (HOSPITAL_COMMUNITY): Payer: Self-pay

## 2011-12-10 DIAGNOSIS — R110 Nausea: Secondary | ICD-10-CM | POA: Insufficient documentation

## 2011-12-10 DIAGNOSIS — R42 Dizziness and giddiness: Secondary | ICD-10-CM | POA: Diagnosis not present

## 2011-12-10 DIAGNOSIS — F0280 Dementia in other diseases classified elsewhere without behavioral disturbance: Secondary | ICD-10-CM | POA: Diagnosis not present

## 2011-12-10 DIAGNOSIS — M542 Cervicalgia: Secondary | ICD-10-CM | POA: Diagnosis not present

## 2011-12-10 DIAGNOSIS — G40909 Epilepsy, unspecified, not intractable, without status epilepticus: Secondary | ICD-10-CM | POA: Diagnosis not present

## 2011-12-10 DIAGNOSIS — I4891 Unspecified atrial fibrillation: Secondary | ICD-10-CM | POA: Diagnosis not present

## 2011-12-10 DIAGNOSIS — E782 Mixed hyperlipidemia: Secondary | ICD-10-CM | POA: Insufficient documentation

## 2011-12-10 DIAGNOSIS — J9819 Other pulmonary collapse: Secondary | ICD-10-CM | POA: Diagnosis not present

## 2011-12-10 DIAGNOSIS — R51 Headache: Secondary | ICD-10-CM | POA: Insufficient documentation

## 2011-12-10 DIAGNOSIS — F79 Unspecified intellectual disabilities: Secondary | ICD-10-CM | POA: Diagnosis not present

## 2011-12-10 DIAGNOSIS — Z79899 Other long term (current) drug therapy: Secondary | ICD-10-CM | POA: Insufficient documentation

## 2011-12-10 DIAGNOSIS — G935 Compression of brain: Secondary | ICD-10-CM | POA: Diagnosis not present

## 2011-12-10 DIAGNOSIS — G309 Alzheimer's disease, unspecified: Secondary | ICD-10-CM | POA: Insufficient documentation

## 2011-12-10 DIAGNOSIS — I6529 Occlusion and stenosis of unspecified carotid artery: Secondary | ICD-10-CM | POA: Diagnosis not present

## 2011-12-10 DIAGNOSIS — F0390 Unspecified dementia without behavioral disturbance: Secondary | ICD-10-CM | POA: Insufficient documentation

## 2011-12-10 LAB — BASIC METABOLIC PANEL
BUN: 22 mg/dL (ref 6–23)
GFR calc Af Amer: 64 mL/min — ABNORMAL LOW (ref >90)
GFR calc non Af Amer: 55 mL/min — ABNORMAL LOW (ref >90)
Potassium: 3.9 mEq/L (ref 3.5–5.1)
Sodium: 139 mEq/L (ref 135–145)

## 2011-12-10 LAB — DIFFERENTIAL
Basophils Relative: 0 % (ref 0–1)
Eosinophils Absolute: 0.1 10*3/uL (ref 0.0–0.7)
Monocytes Relative: 13 % — ABNORMAL HIGH (ref 3–12)
Neutrophils Relative %: 71 % (ref 43–77)

## 2011-12-10 LAB — CBC
MCH: 29.5 pg (ref 26.0–34.0)
MCHC: 34.2 g/dL (ref 30.0–36.0)
Platelets: 106 10*3/uL — ABNORMAL LOW (ref 150–400)

## 2011-12-10 MED ORDER — LORAZEPAM 1 MG PO TABS
1.0000 mg | ORAL_TABLET | Freq: Once | ORAL | Status: AC
  Administered 2011-12-10: 1 mg via ORAL
  Filled 2011-12-10: qty 1

## 2011-12-10 MED ORDER — MECLIZINE HCL 12.5 MG PO TABS
25.0000 mg | ORAL_TABLET | Freq: Once | ORAL | Status: AC
  Administered 2011-12-10: 25 mg via ORAL
  Filled 2011-12-10: qty 2

## 2011-12-10 MED ORDER — MECLIZINE HCL 12.5 MG PO TABS: 12.5000 mg | ORAL_TABLET | Freq: Three times a day (TID) | ORAL | Status: AC | PRN

## 2011-12-10 MED ORDER — GADOBENATE DIMEGLUMINE 529 MG/ML IV SOLN
8.0000 mL | Freq: Once | INTRAVENOUS | Status: AC | PRN
  Administered 2011-12-10: 8 mL via INTRAVENOUS

## 2011-12-10 MED ORDER — ONDANSETRON 4 MG PO TBDP
4.0000 mg | ORAL_TABLET | Freq: Once | ORAL | Status: AC
  Administered 2011-12-10: 4 mg via ORAL
  Filled 2011-12-10: qty 1

## 2011-12-10 NOTE — Discharge Instructions (Signed)
Dizziness Your testing today was negative for stroke. Take the medicine for dizziness as prescribed and follow up with Dr. Margo Aye. Return to the ED if you develop new or worsening symptoms. Dizziness is a common problem. It is a feeling of unsteadiness or lightheadedness. You may feel like you are about to faint. Dizziness can lead to injury if you stumble or fall. A person of any age group can suffer from dizziness, but dizziness is more common in older adults. CAUSES  Dizziness can be caused by many different things, including:  Middle ear problems.   Standing for too long.   Infections.   An allergic reaction.   Aging.   An emotional response to something, such as the sight of blood.   Side effects of medicines.   Fatigue.   Problems with circulation or blood pressure.   Excess use of alcohol, medicines, or illegal drug use.   Breathing too fast (hyperventilation).   An arrhythmia or problems with your heart rhythm.   Low red blood cell count (anemia).   Pregnancy.   Vomiting, diarrhea, fever, or other illnesses that cause dehydration.   Diseases or conditions such as Parkinson's disease, high blood pressure (hypertension), diabetes, and thyroid problems.   Exposure to extreme heat.  DIAGNOSIS  To find the cause of your dizziness, your caregiver may do a physical exam, lab tests, radiologic imaging scans, or an electrocardiography test (ECG).  TREATMENT  Treatment of dizziness depends on the cause of your symptoms and can vary greatly. HOME CARE INSTRUCTIONS   Drink enough fluids to keep your urine clear or pale yellow. This is especially important in very hot weather. In the elderly, it is also important in cold weather.   If your dizziness is caused by medicines, take them exactly as directed. When taking blood pressure medicines, it is especially important to get up slowly.   Rise slowly from chairs and steady yourself until you feel okay.   In the morning,  first sit up on the side of the bed. When this seems okay, stand slowly while holding onto something until you know your balance is fine.   If you need to stand in one place for a long time, be sure to move your legs often. Tighten and relax the muscles in your legs while standing.   If dizziness continues to be a problem, have someone stay with you for a day or two. Do this until you feel you are well enough to stay alone. Have the person call your caregiver if he or she notices changes in you that are concerning.   Do not drive or use heavy machinery if you feel dizzy.  SEEK IMMEDIATE MEDICAL CARE IF:   Your dizziness or lightheadedness gets worse.   You feel nauseous or vomit.   You develop problems with talking, walking, weakness, or using your arms, hands, or legs.   You are not thinking clearly or you have difficulty forming sentences. It may take a friend or family member to determine if your thinking is normal.   You develop chest pain, abdominal pain, shortness of breath, or sweating.   Your vision changes.   You notice any bleeding.   You have side effects from medicine that seems to be getting worse rather than better.  MAKE SURE YOU:   Understand these instructions.   Will watch your condition.   Will get help right away if you are not doing well or get worse.  Document Released: 12/02/2000 Document  Revised: 05/28/2011 Document Reviewed: 12/26/2010 [facility] Patient Information 2012 [place], [place].

## 2011-12-10 NOTE — ED Notes (Signed)
Pt back from MRI 

## 2011-12-10 NOTE — ED Provider Notes (Signed)
History   This chart was scribed for Glynn Octave, MD by Clarita Crane. The patient was seen in room [facility]2/[facility]2. Patient's care was started at 1444.    CSN: [id number]  Arrival date & time 12/10/11  1444   First MD Initiated Contact with Patient 12/10/11 1501      Chief Complaint  Patient presents with  . Headache  . Dizziness  . Nausea    (Consider location/radiation/quality/duration/timing/severity/associated sxs/prior treatment) HPI [name] [name] is a 78 y.o. female who presents to the Emergency Department complaining of constant moderate to severe dizziness describe as the room spinning onset last night and persistent since with associated nausea and vomiting last night that has not reoccurred today. Denies blurred vision, visual disturbances, neck pain, HA. Patient with h/o anemia, a-fib, seizure disorder, MR, alzheimer's, mixed HLD, diverticulosis, pneumonia, colonic polyps.   Past Medical History  Diagnosis Date  . Anemia     Gastritis/duodenitis  . Atrial fibrillation   . Seizure disorder   . Mental retardation   . Alzheimer's dementia   . Mixed hyperlipidemia   . Osteoarthritis   . Hx of colonic polyps   . Pneumonia 8/10  . Diverticulosis     History reviewed. No pertinent past surgical history.  No family history on file.  History  Substance Use Topics  . Smoking status: Never Smoker   . Smokeless tobacco: Never Used   Comment: tobacco use - no  . Alcohol Use: No    OB History    Grav Para Term Preterm Abortions TAB SAB Ect Mult Living                  Review of Systems A complete 10 system review of systems was obtained and all systems are negative except as noted in the HPI and PMH.   Allergies  Review of patient's allergies indicates no known allergies.  Home Medications   Current Outpatient Rx  Name Route Sig Dispense Refill  . AMIODARONE HCL 200 MG PO TABS Oral Take 200 mg by mouth every morning.    . ASPIRIN 81 MG PO TBEC Oral  Take 81 mg by mouth every morning.     Marland Kitchen CALTRATE 600+D PLUS 600-400 MG-UNIT PO TABS Oral Take 1 tablet by mouth at bedtime.     Marland Kitchen DOCUSATE SODIUM 100 MG PO CAPS Oral Take 100 mg by mouth at bedtime.      . DONEPEZIL HCL 5 MG PO TABS Oral Take 5 mg by mouth at bedtime.     Marland Kitchen POLYSACCHARIDE IRON COMPLEX 150 MG PO CAPS Oral Take 150 mg by mouth 2 (two) times daily.      Marland Kitchen LEVETIRACETAM 500 MG PO TABS Oral Take 500 mg by mouth 2 (two) times daily.      Marland Kitchen LEVOTHYROXINE SODIUM 75 MCG PO TABS Oral Take 37.5 mcg by mouth every morning.     Marland Kitchen OMEPRAZOLE 20 MG PO CPDR Oral Take 20 mg by mouth every morning.     Marland Kitchen POTASSIUM CHLORIDE CRYS ER 10 MEQ PO TBCR Oral Take 10 mEq by mouth 2 (two) times daily.    Marland Kitchen SIMVASTATIN 20 MG PO TABS Oral Take 20 mg by mouth at bedtime.       BP 115/57  Pulse 62  Temp 97.7 F (36.5 C) (Oral)  Resp 18  SpO2 100%  Physical Exam  Nursing note and vitals reviewed. Constitutional: She is oriented to person, place, and time. She appears well-developed and well-nourished. No  distress.  HENT:  Head: Normocephalic and atraumatic.  Eyes: EOM are normal. Pupils are equal, round, and reactive to light.  Neck: Neck supple. No tracheal deviation present.  Cardiovascular: Normal rate and regular rhythm.   No murmur heard. Pulmonary/Chest: Effort normal. No respiratory distress. She has no wheezes. She exhibits no tenderness.  Abdominal: Soft. She exhibits no distension.  Musculoskeletal: Normal range of motion. She exhibits no edema.  Neurological: She is alert and oriented to person, place, and time. No cranial nerve deficit or sensory deficit. Coordination normal.       No catch up saccades noted with head impulse testing. No ataxia with finger to nose. Strength 5/5 within bilateral upper and lower extremities. No nystagmus.   Skin: Skin is warm and dry.  Psychiatric: She has a normal mood and affect. Her behavior is normal.    ED Course  Procedures (including critical  care time)  DIAGNOSTIC STUDIES: Oxygen Saturation is 100% on room air, normal by my interpretation.    COORDINATION OF CARE:    Labs Reviewed  CBC - Abnormal; Notable for the following:    Platelets 106 (*)     All other components within normal limits  DIFFERENTIAL - Abnormal; Notable for the following:    Monocytes Relative 13 (*)     All other components within normal limits  BASIC METABOLIC PANEL - Abnormal; Notable for the following:    Glucose, Bld 112 (*)     GFR calc non Af Amer 55 (*)     GFR calc Af Amer 64 (*)     All other components within normal limits  TROPONIN I   Ct Head Wo Contrast  12/10/2011  *RADIOLOGY REPORT*  Clinical Data: Headache with dizziness and nausea.  CT HEAD WITHOUT CONTRAST  Technique:  Contiguous axial images were obtained from the base of the skull through the vertex without contrast.  Comparison: 08/28/2009 MR.  08/18/2009 and 01/23/2009 CT.  Findings: Chiari one malformation.  Global atrophy.  Ventricular prominence unchanged and may be related to atrophy although difficult to completely exclude a component of hydrocephalus.  Small vessel disease type changes.  No CT evidence of large acute infarct.  No intracranial mass lesion detected on this unenhanced exam.  Opacification right maxillary sinus.  Opacification right external auditory canal.  IMPRESSION: No intracranial hemorrhage or CT evidence of large acute infarct.  Global atrophy with ventricular prominence unchanged as discussed above.  Chiari one malformation.  Opacification right maxillary sinus.  Original Report Authenticated By: Fuller Canada, M.D.   Mr [facility] Wo Contrast  12/10/2011  *RADIOLOGY REPORT*  Clinical Data:  Dizziness.  Mental retardation.  MRI HEAD WITH AND WITHOUT CONTRAST MRA HEAD WITHOUT CONTRAST MRA NECK WITHOUT AND WITH CONTRAST  Technique: Multiplanar, multiecho pulse sequences of the brain and surrounding structures were obtained according to standard protocol with  and without intravenous contrast.  Angiographic images of the Circle of Willis were obtained using MRA technique without intravenous contrast.  Angiographic images of the neck were obtained using MRA technique without and with intravenous contrast.  Contrast:8 ml MultiHance.  Comparison:12/10/2011 head CT.  08/28/2009 brain MR.  MRI HEAD  Findings: No acute infarct.  No intracranial hemorrhage.  No intracranial mass or abnormal enhancement noted on this motion degraded exam.  Global atrophy.  Ventricular prominence may be related to atrophy although difficult to completely exclude a mild component of superimposed hydrocephalus.  Appearance is without change.  Small vessel disease type changes.  Cerebellar tonsils low lying.  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  The cervical medullary junction appears prominent and there is slight flattening of the anterior margin of the pons.  Findings are without change.  Excess cervical curvature with mild spinal stenosis.  Small right maxillary sinus is completely opacified.  Minimal mucosal thickening left maxillary sinus.  IMPRESSION: No acute infarct.  No intracranial mass or abnormal enhancement noted on this motion degraded exam.  Global atrophy.  Ventricular prominence without change.  Small vessel disease type changes.  Cerebellar tonsils low lying.  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  The cervical medullary junction appears prominent and there is slight flattening of the anterior margin of the pons.  Findings are without change.  Excess cervical curvature with mild spinal stenosis.  MRA HEAD  Findings:Small bulge pre cavernous segment of the left internal carotid artery with an appearance of artifact rather than aneurysm.  Mild narrowing and irregularity of the cavernous segment of the internal carotid artery more notable on the left.  Mild  ectasia supraclinoid segment of the internal carotid artery bilaterally.  Arising from the superior margin of the M1 segment of the left middle cerebral artery is a 3.2 x 1.1 mm structure which may represent a small aneurysm.  Mild narrowing A1 segment right anterior cerebral artery.  Middle cerebral artery branch vessel irregularity bilaterally.  Mild narrowing of the distal vertebral artery bilaterally.  Poor delineation of the PICAs and AICAs.  No high-grade stenosis of the basilar artery.  Posterior cerebral artery branch vessel irregularity more notable on the right.  Irregular appearance of the superior cerebellar arteries bilaterally.  IMPRESSION: Intracranial atherosclerotic type changes most notable involving branch vessels.  Arising from the superior margin of the M1 segment of the left middle cerebral artery is a 3.2 x 1.1 mm structure which may represent a small aneurysm.  Please see above.  MRA NECK  Findings:Common origin of the innominate artery and left common carotid artery from the aortic arch.  Small bulge undersurface of the aortic arch consistent with a small ductus diverticulum.  Ectatic right common carotid artery with kink and mild narrowing.  No significant stenosis of the right carotid bifurcation.  Mild ectasia of the left common carotid artery.  No significant stenosis of the left carotid bifurcation.  No significant narrowing of the right subclavian artery.  Minimal irregularity left subclavian artery.  No significant stenosis of the right vertebral artery throughout the majority of its course.  There is a focal bulge of the right vertebral artery beyond the C1-2 junction.  This may be related to artifact although small aneurysm or result of prior dissection not excluded.  Moderate  narrowing proximal left vertebral artery.  This is a common area for artifactual loss of signal and therefore may overestimate the degree of stenosis.  IMPRESSION: Ectatic right common carotid artery with  kink and mild narrowing.  No significant stenosis of the right carotid bifurcation.  Mild ectasia of the left common carotid artery.  No significant stenosis of the left carotid bifurcation.  No significant stenosis of the right vertebral artery throughout the majority of its course.  There is a focal bulge of the right vertebral artery beyond the C1-2 junction.  This may be related to artifact although small aneurysm or result of prior dissection not excluded.  Moderate  narrowing proximal left vertebral artery.  This is a common area for  artifactual loss of signal and therefore may overestimate the degree of stenosis.  Original Report Authenticated By: Fuller Canada, M.D.   Mr Angiogram Neck W Wo Contrast  12/10/2011  *RADIOLOGY REPORT*  Clinical Data:  Dizziness.  Mental retardation.  MRI HEAD WITH AND WITHOUT CONTRAST MRA HEAD WITHOUT CONTRAST MRA NECK WITHOUT AND WITH CONTRAST  Technique: Multiplanar, multiecho pulse sequences of the brain and surrounding structures were obtained according to standard protocol with and without intravenous contrast.  Angiographic images of the Circle of Willis were obtained using MRA technique without intravenous contrast.  Angiographic images of the neck were obtained using MRA technique without and with intravenous contrast.  Contrast:8 ml MultiHance.  Comparison:12/10/2011 head CT.  08/28/2009 brain MR.  MRI HEAD  Findings: No acute infarct.  No intracranial hemorrhage.  No intracranial mass or abnormal enhancement noted on this motion degraded exam.  Global atrophy.  Ventricular prominence may be related to atrophy although difficult to completely exclude a mild component of superimposed hydrocephalus.  Appearance is without change.  Small vessel disease type changes.  Cerebellar tonsils low lying.  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  The cervical medullary junction appears prominent and there is slight  flattening of the anterior margin of the pons.  Findings are without change.  Excess cervical curvature with mild spinal stenosis.  Small right maxillary sinus is completely opacified.  Minimal mucosal thickening left maxillary sinus.  IMPRESSION: No acute infarct.  No intracranial mass or abnormal enhancement noted on this motion degraded exam.  Global atrophy.  Ventricular prominence without change.  Small vessel disease type changes.  Cerebellar tonsils low lying.  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  The cervical medullary junction appears prominent and there is slight flattening of the anterior margin of the pons.  Findings are without change.  Excess cervical curvature with mild spinal stenosis.  MRA HEAD  Findings:Small bulge pre cavernous segment of the left internal carotid artery with an appearance of artifact rather than aneurysm.  Mild narrowing and irregularity of the cavernous segment of the internal carotid artery more notable on the left.  Mild ectasia supraclinoid segment of the internal carotid artery bilaterally.  Arising from the superior margin of the M1 segment of the left middle cerebral artery is a 3.2 x 1.1 mm structure which may represent a small aneurysm.  Mild narrowing A1 segment right anterior cerebral artery.  Middle cerebral artery branch vessel irregularity bilaterally.  Mild narrowing of the distal vertebral artery bilaterally.  Poor delineation of the PICAs and AICAs.  No high-grade stenosis of the basilar artery.  Posterior cerebral artery branch vessel irregularity more notable on the right.  Irregular appearance of the superior cerebellar arteries bilaterally.  IMPRESSION: Intracranial atherosclerotic type changes most notable involving branch vessels.  Arising from the superior margin of the M1 segment of the left middle cerebral artery is a 3.2 x 1.1 mm structure which may represent a small aneurysm.  Please see  above.  MRA NECK  Findings:Common origin of the innominate artery and left common carotid artery from the aortic arch.  Small bulge undersurface of the aortic arch consistent with a small ductus diverticulum.  Ectatic right common carotid artery with kink and mild narrowing.  No significant stenosis of the right carotid bifurcation.  Mild ectasia of the left common carotid artery.  No significant stenosis of the left carotid bifurcation.  No significant narrowing of  the right subclavian artery.  Minimal irregularity left subclavian artery.  No significant stenosis of the right vertebral artery throughout the majority of its course.  There is a focal bulge of the right vertebral artery beyond the C1-2 junction.  This may be related to artifact although small aneurysm or result of prior dissection not excluded.  Moderate  narrowing proximal left vertebral artery.  This is a common area for artifactual loss of signal and therefore may overestimate the degree of stenosis.  IMPRESSION: Ectatic right common carotid artery with kink and mild narrowing.  No significant stenosis of the right carotid bifurcation.  Mild ectasia of the left common carotid artery.  No significant stenosis of the left carotid bifurcation.  No significant stenosis of the right vertebral artery throughout the majority of its course.  There is a focal bulge of the right vertebral artery beyond the C1-2 junction.  This may be related to artifact although small aneurysm or result of prior dissection not excluded.  Moderate  narrowing proximal left vertebral artery.  This is a common area for artifactual loss of signal and therefore may overestimate the degree of stenosis.  Original Report Authenticated By: Fuller Canada, M.D.   Mr [facility] NG Contrast  12/10/2011  *RADIOLOGY REPORT*  Clinical Data:  Dizziness.  Mental retardation.  MRI HEAD WITH AND WITHOUT CONTRAST MRA HEAD WITHOUT CONTRAST MRA NECK WITHOUT AND WITH CONTRAST  Technique:  Multiplanar, multiecho pulse sequences of the brain and surrounding structures were obtained according to standard protocol with and without intravenous contrast.  Angiographic images of the Circle of Willis were obtained using MRA technique without intravenous contrast.  Angiographic images of the neck were obtained using MRA technique without and with intravenous contrast.  Contrast:8 ml MultiHance.  Comparison:12/10/2011 head CT.  08/28/2009 brain MR.  MRI HEAD  Findings: No acute infarct.  No intracranial hemorrhage.  No intracranial mass or abnormal enhancement noted on this motion degraded exam.  Global atrophy.  Ventricular prominence may be related to atrophy although difficult to completely exclude a mild component of superimposed hydrocephalus.  Appearance is without change.  Small vessel disease type changes.  Cerebellar tonsils low lying.  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  The cervical medullary junction appears prominent and there is slight flattening of the anterior margin of the pons.  Findings are without change.  Excess cervical curvature with mild spinal stenosis.  Small right maxillary sinus is completely opacified.  Minimal mucosal thickening left maxillary sinus.  IMPRESSION: No acute infarct.  No intracranial mass or abnormal enhancement noted on this motion degraded exam.  Global atrophy.  Ventricular prominence without change.  Small vessel disease type changes.  Cerebellar tonsils low lying.  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  The cervical medullary junction appears prominent and there is slight flattening of the anterior margin of the pons.  Findings are without change.  Excess cervical curvature with mild spinal stenosis.  MRA HEAD  Findings:Small bulge pre cavernous segment of the left internal carotid artery with an appearance of artifact rather than  aneurysm.  Mild narrowing and irregularity of the cavernous segment of the internal carotid artery more notable on the left.  Mild ectasia supraclinoid segment of the internal carotid artery bilaterally.  Arising from the superior margin of the M1 segment of the left middle cerebral artery is a 3.2 x 1.1 mm structure which may represent a small  aneurysm.  Mild narrowing A1 segment right anterior cerebral artery.  Middle cerebral artery branch vessel irregularity bilaterally.  Mild narrowing of the distal vertebral artery bilaterally.  Poor delineation of the PICAs and AICAs.  No high-grade stenosis of the basilar artery.  Posterior cerebral artery branch vessel irregularity more notable on the right.  Irregular appearance of the superior cerebellar arteries bilaterally.  IMPRESSION: Intracranial atherosclerotic type changes most notable involving branch vessels.  Arising from the superior margin of the M1 segment of the left middle cerebral artery is a 3.2 x 1.1 mm structure which may represent a small aneurysm.  Please see above.  MRA NECK  Findings:Common origin of the innominate artery and left common carotid artery from the aortic arch.  Small bulge undersurface of the aortic arch consistent with a small ductus diverticulum.  Ectatic right common carotid artery with kink and mild narrowing.  No significant stenosis of the right carotid bifurcation.  Mild ectasia of the left common carotid artery.  No significant stenosis of the left carotid bifurcation.  No significant narrowing of the right subclavian artery.  Minimal irregularity left subclavian artery.  No significant stenosis of the right vertebral artery throughout the majority of its course.  There is a focal bulge of the right vertebral artery beyond the C1-2 junction.  This may be related to artifact although small aneurysm or result of prior dissection not excluded.  Moderate  narrowing proximal left vertebral artery.  This is a common area for  artifactual loss of signal and therefore may overestimate the degree of stenosis.  IMPRESSION: Ectatic right common carotid artery with kink and mild narrowing.  No significant stenosis of the right carotid bifurcation.  Mild ectasia of the left common carotid artery.  No significant stenosis of the left carotid bifurcation.  No significant stenosis of the right vertebral artery throughout the majority of its course.  There is a focal bulge of the right vertebral artery beyond the C1-2 junction.  This may be related to artifact although small aneurysm or result of prior dissection not excluded.  Moderate  narrowing proximal left vertebral artery.  This is a common area for artifactual loss of signal and therefore may overestimate the degree of stenosis.  Original Report Authenticated By: Fuller Canada, M.D.     No diagnosis found.    MDM  Difficult historian presenting with dizziness, lightheadedness, nausea and vomiting for the past day. She denies headache contrary to triage note. No chest pain, shortness of breath, fever chills.  No focal neuro deficits on exam. No ataxia finger to nose, no nystagmus,  No hemorrhage on CT MRI negative for acute infarct. No significant vertebral artery stenosis. Small possible aneurysm of left MCA M1branch noted and discussed with patient and family.  Ambulatory in the ED without assistance. No vomiting.     Date: 12/10/2011  Rate: 62  Rhythm: normal sinus rhythm  QRS Axis: left  Intervals: normal  ST/T Wave abnormalities: nonspecific ST/T changes  Conduction Disutrbances:left anterior fascicular block  Narrative Interpretation: Unchanged septal Q waves  Old EKG Reviewed: unchanged     I personally performed the services described in this documentation, which was scribed in my presence.  The recorded information has been reviewed and considered.    Glynn Octave, MD 12/10/11 2131

## 2011-12-10 NOTE — ED Notes (Signed)
19:41 ambulated patient and she walked with an unsteady gate. Complained of being dizzy still.

## 2011-12-10 NOTE — ED Notes (Signed)
Pt taken to MRI  

## 2011-12-10 NOTE — ED Notes (Signed)
Caregiver reports that pt began complaining w/ headache last night and has been dizzy today and having nausea

## 2011-12-10 NOTE — ED Notes (Addendum)
Ambulated patient 2nd time. She walked with assistance to use the bathroom. Walk improved compared to last time. Gate still a little unsteady.

## 2012-02-02 DIAGNOSIS — K921 Melena: Secondary | ICD-10-CM | POA: Diagnosis not present

## 2012-02-03 ENCOUNTER — Encounter: Payer: Self-pay | Admitting: Gastroenterology

## 2012-02-04 ENCOUNTER — Encounter: Payer: Self-pay | Admitting: Gastroenterology

## 2012-02-04 ENCOUNTER — Ambulatory Visit (INDEPENDENT_AMBULATORY_CARE_PROVIDER_SITE_OTHER): Payer: Medicare Other | Admitting: Gastroenterology

## 2012-02-04 VITALS — BP 103/59 | HR 70 | Temp 98.2°F | Ht 60.0 in | Wt 94.0 lb

## 2012-02-04 DIAGNOSIS — K625 Hemorrhage of anus and rectum: Secondary | ICD-10-CM | POA: Diagnosis not present

## 2012-02-04 MED ORDER — HYDROCORTISONE 2.5 % RE CREA: TOPICAL_CREAM | RECTAL | Status: AC

## 2012-02-04 NOTE — Patient Instructions (Addendum)
Start using the anusol cream twice a day for 7 days.  Avoid constipation. Drink at least 6-8 glasses of water a day. Eat a high fiber diet. Take 2 stool softeners a day. Continue to take the prunes, as this seems to be working well. If this does not, you may take Miralax 1 capful daily. Watch for loose stools.  We will see you back in 6 weeks.  I am requesting your blood work. If this is abnormal, we may need to do further work-up. Contact us immediately if you have any problems or increased rectal bleeding.

## 2012-02-04 NOTE — Progress Notes (Signed)
Referring Provider: Dwana Melena, MD Primary Care Physician:  Dwana Melena, MD Primary Gastroenterologist: Dr. Darrick Penna   Chief Complaint  Patient presents with  . Rectal Bleeding  . Hemorrhoids    HPI:   Pt presents today with cousin, who is primary caregiver. Pt is not eating right per cousin, states she has to be "keeping up with him". Pt told cousin had blood in stool recently, moderate amount, now today much better. Saw Dr. Margo Aye that week. Last few days eating 4-5 prunes a night. Now soft BMs,  Pt panics when gets constipated. No abdominal pain. Good appetite. No N/V. No rectal pain. No rectal itching.   Past Medical History  Diagnosis Date  . Anemia     Gastritis/duodenitis  . Atrial fibrillation   . Seizure disorder   . Mental retardation   . Alzheimer's dementia   . Mixed hyperlipidemia   . Osteoarthritis   . Hx of colonic polyps   . Pneumonia 8/10  . Diverticulosis     Past Surgical History  Procedure Date  . Ileocolonoscopy 01/18/2009     simple adenomas/Moderate diverticulosis, torturous colon, normal TI, vascular prominence near dentate line.  . Esophagogastroduodenoscopy 01/18/2009    Severe chronic gastritis, peptic duodenitis  . Hemorrhoid repair   . Cholecystectomy     Current Outpatient Prescriptions  Medication Sig Dispense Refill  . amiodarone (PACERONE) 200 MG tablet Take 200 mg by mouth every morning.      . Calcium Carbonate-Vit D-Min (CALTRATE 600+D PLUS) 600-400 MG-UNIT per tablet Take 1 tablet by mouth at bedtime.       . docusate sodium (COLACE) 100 MG capsule Take 100 mg by mouth at bedtime.        . donepezil (ARICEPT) 5 MG tablet Take 5 mg by mouth at bedtime.       . iron polysaccharides (NIFEREX) 150 MG capsule Take 150 mg by mouth 2 (two) times daily.        Marland Kitchen levETIRAcetam (KEPPRA) 500 MG tablet Take 500 mg by mouth 2 (two) times daily.        Marland Kitchen levothyroxine (SYNTHROID, LEVOTHROID) 75 MCG tablet Take 37.5 mcg by mouth every morning.       Marland Kitchen  omeprazole (PRILOSEC) 20 MG capsule Take 20 mg by mouth every morning.       . potassium chloride (K-DUR,KLOR-CON) 10 MEQ tablet Take 10 mEq by mouth 2 (two) times daily.      . simvastatin (ZOCOR) 20 MG tablet Take 20 mg by mouth at bedtime.       Marland Kitchen aspirin 81 MG EC tablet Take 81 mg by mouth every morning.       . hydrocortisone (ANUSOL-HC) 2.5 % rectal cream Apply rectally 2 times daily for 7 days.  30 g  1    Allergies as of 02/04/2012  . (No Known Allergies)      History   Social History  . Marital Status: Single    Spouse Name: N/A    Number of Children: N/A  . Years of Education: N/A   Social History Main Topics  . Smoking status: Never Smoker   . Smokeless tobacco: Never Used   Comment: tobacco use - no  . Alcohol Use: No  . Drug Use: No  . Sexually Active: None   Other Topics Concern  . None   Social History Narrative  . None    Review of Systems: Gen: Denies fever, chills, anorexia. Denies fatigue, weakness, weight loss.  CV: Denies  chest pain, palpitations, syncope, peripheral edema, and claudication. Resp: Denies dyspnea at rest, cough, wheezing, coughing up blood, and pleurisy. GI: Denies vomiting blood, jaundice, and fecal incontinence.   Denies dysphagia or odynophagia. Derm: Denies rash, itching, dry skin Psych: Denies depression, anxiety, memory loss, confusion. No homicidal or suicidal ideation.  Heme: Denies bruising, bleeding, and enlarged lymph nodes.  Physical Exam: BP 103/59  Pulse 70  Temp 98.2 F (36.8 C) (Temporal)  Ht 5' (1.524 m)  Wt 94 lb (42.638 kg)  BMI 18.36 kg/m2 General:   Alert and oriented. No distress noted. Pleasant and cooperative.  Head:  Normocephalic and atraumatic. Eyes:  Conjuctiva clear without scleral icterus. Mouth:  Oral mucosa pink and moist. Good dentition. No lesions. Neck:  Supple, without mass or thyromegaly. Heart:  S1, S2 present without murmurs, rubs, or gallops. Regular rate and rhythm. Abdomen:  +BS,  soft, non-tender and non-distended. No rebound or guarding. No HSM or masses noted. Rectal: 2 non-thrombosed hemorrhoids Msk:  Symmetrical without gross deformities. kyphosis Extremities:  Without edema. Neurologic:  Alert and  oriented x4;  grossly normal neurologically. Skin:  Intact without significant lesions or rashes. Cervical Nodes:  No significant cervical adenopathy. Psych:  Alert and cooperative. Normal mood and affect.

## 2012-02-09 DIAGNOSIS — Z111 Encounter for screening for respiratory tuberculosis: Secondary | ICD-10-CM | POA: Diagnosis not present

## 2012-02-10 ENCOUNTER — Encounter: Payer: Self-pay | Admitting: Gastroenterology

## 2012-02-10 DIAGNOSIS — K625 Hemorrhage of anus and rectum: Secondary | ICD-10-CM | POA: Insufficient documentation

## 2012-02-10 NOTE — Assessment & Plan Note (Signed)
78 year old with one incidence of moderate amount hematochezia in setting of constipation. Last TCS in 2010 as above. On rectal exam, 2 non-thrombosed hemorrhoids. Question benign anorectal source. No labs available today, but these have been requested. Needs to avoid constipation, obtain labs, if any evidence of anemia or any further rectal bleeding despite anusol cream, proceed with TCS. Follow-up in 6 weeks.

## 2012-02-11 NOTE — Progress Notes (Signed)
Faxed to PCP

## 2012-03-04 DIAGNOSIS — K921 Melena: Secondary | ICD-10-CM | POA: Diagnosis not present

## 2012-03-04 DIAGNOSIS — E039 Hypothyroidism, unspecified: Secondary | ICD-10-CM | POA: Diagnosis not present

## 2012-03-04 DIAGNOSIS — E785 Hyperlipidemia, unspecified: Secondary | ICD-10-CM | POA: Diagnosis not present

## 2012-03-17 ENCOUNTER — Ambulatory Visit: Payer: Medicare Other | Admitting: Gastroenterology

## 2012-03-24 ENCOUNTER — Ambulatory Visit (INDEPENDENT_AMBULATORY_CARE_PROVIDER_SITE_OTHER): Payer: Medicare Other | Admitting: Gastroenterology

## 2012-03-24 ENCOUNTER — Encounter: Payer: Self-pay | Admitting: Gastroenterology

## 2012-03-24 VITALS — BP 88/54 | HR 73 | Temp 97.0°F | Ht 60.0 in | Wt 98.4 lb

## 2012-03-24 DIAGNOSIS — K625 Hemorrhage of anus and rectum: Secondary | ICD-10-CM

## 2012-03-24 NOTE — Patient Instructions (Signed)
CONTINUE OMEPRAZOLE & COLACE.  FOLLOW UP AS NEEDED.

## 2012-03-24 NOTE — Progress Notes (Signed)
  Subjective:    Patient ID: [name] [name], female    DOB: [birth year], 78 y.o.   MRN: [id number]  PCP:  HPI NO MORE BLEEDING. BM BOUT EVERY DAY. NO ABD PAIN. LAST HB JUN 2013 13.8.  Past Medical History  Diagnosis Date  . Anemia     Gastritis/duodenitis  . Atrial fibrillation   . Seizure disorder   . Mental retardation   . Alzheimer's dementia   . Mixed hyperlipidemia   . Osteoarthritis   . Hx of colonic polyps   . Pneumonia 8/10  . Diverticulosis     Past Surgical History  Procedure Date  . Ileocolonoscopy 01/18/2009     simple adenomas/Moderate diverticulosis, torturous colon, normal TI, vascular prominence near dentate line.  . Esophagogastroduodenoscopy 01/18/2009    Severe chronic gastritis, peptic duodenitis  . Hemorrhoid repair   . Cholecystectomy     No Known Allergies  Current Outpatient Prescriptions  Medication Sig Dispense Refill  . amiodarone (PACERONE) 200 MG tablet Take 200 mg by mouth every morning.      Marland Kitchen aspirin 81 MG EC tablet Take 81 mg by mouth every morning.       . beta carotene w/minerals (OCUVITE) tablet Take 1 tablet by mouth daily.      . Calcium Carbonate-Vit D-Min (CALTRATE 600+D PLUS) 600-400 MG-UNIT per tablet Take 1 tablet by mouth at bedtime.       . docusate sodium (COLACE) 100 MG capsule Take 100 mg by mouth at bedtime.        . donepezil (ARICEPT) 5 MG tablet Take 5 mg by mouth at bedtime.       . iron polysaccharides (NIFEREX) 150 MG capsule Take 150 mg by mouth 2 (two) times daily.        Marland Kitchen levETIRAcetam (KEPPRA) 500 MG tablet Take 500 mg by mouth 2 (two) times daily.        Marland Kitchen levothyroxine (SYNTHROID, LEVOTHROID) 75 MCG tablet Take 37.5 mcg by mouth every morning.       . meclizine (ANTIVERT) 25 MG tablet Take 25 mg by mouth 3 (three) times daily as needed.      Marland Kitchen omeprazole (PRILOSEC) 20 MG capsule Take 20 mg by mouth every morning.       . potassium chloride (K-DUR,KLOR-CON) 10 MEQ tablet Take 10 mEq by mouth 2 (two) times  daily.      . simvastatin (ZOCOR) 20 MG tablet Take 20 mg by mouth at bedtime.       .          Review of Systems     Objective:   Physical Exam  Vitals reviewed. Constitutional: She is oriented to person, place, and time. She appears well-nourished. No distress.  HENT:  Head: Normocephalic and atraumatic.       BIL HEARING AIDS   Eyes: Pupils are equal, round, and reactive to light. No scleral icterus.  Cardiovascular: Normal rate, regular rhythm and normal heart sounds.   Pulmonary/Chest: Effort normal and breath sounds normal. No respiratory distress.  Abdominal: Soft. Bowel sounds are normal. She exhibits no distension.  Neurological: She is alert and oriented to person, place, and time.       NO  NEW FOCAL DEFICITS            Assessment & Plan:

## 2012-03-24 NOTE — Assessment & Plan Note (Signed)
SX RESOLVED. MOST LIKELY DUE TO HEMORRHOIDS.  CONTINUE COLACE. AVOID CONSTIPATION. OPV PRN

## 2012-03-24 NOTE — Progress Notes (Signed)
Faxed to PCP

## 2012-03-29 ENCOUNTER — Other Ambulatory Visit: Payer: Self-pay

## 2012-03-29 MED ORDER — AMIODARONE HCL 200 MG PO TABS: 200.0000 mg | ORAL_TABLET | ORAL | Status: DC

## 2012-04-03 NOTE — Progress Notes (Signed)
REVIEWED. AGREE. 

## 2012-07-05 DIAGNOSIS — K219 Gastro-esophageal reflux disease without esophagitis: Secondary | ICD-10-CM | POA: Diagnosis not present

## 2012-07-05 DIAGNOSIS — I4891 Unspecified atrial fibrillation: Secondary | ICD-10-CM | POA: Diagnosis not present

## 2012-07-05 DIAGNOSIS — E785 Hyperlipidemia, unspecified: Secondary | ICD-10-CM | POA: Diagnosis not present

## 2012-07-05 DIAGNOSIS — E039 Hypothyroidism, unspecified: Secondary | ICD-10-CM | POA: Diagnosis not present

## 2012-07-14 ENCOUNTER — Encounter: Payer: Self-pay | Admitting: Cardiology

## 2012-07-14 DIAGNOSIS — Z0189 Encounter for other specified special examinations: Secondary | ICD-10-CM | POA: Insufficient documentation

## 2012-07-19 ENCOUNTER — Other Ambulatory Visit: Payer: Self-pay | Admitting: Cardiology

## 2012-07-19 MED ORDER — AMIODARONE HCL 200 MG PO TABS: 200.0000 mg | ORAL_TABLET | ORAL | Status: DC

## 2012-11-16 ENCOUNTER — Encounter: Payer: Self-pay | Admitting: Cardiology

## 2012-11-16 ENCOUNTER — Ambulatory Visit (INDEPENDENT_AMBULATORY_CARE_PROVIDER_SITE_OTHER): Payer: Medicare Other | Admitting: Adult Health

## 2012-11-16 ENCOUNTER — Ambulatory Visit (HOSPITAL_COMMUNITY)
Admission: RE | Admit: 2012-11-16 | Discharge: 2012-11-16 | Disposition: A | Discharge status: Home / Self Care | Payer: Medicare Other | Source: Ambulatory Visit | Attending: Adult Health | Admitting: Adult Health

## 2012-11-16 VITALS — BP 102/64 | HR 73 | Ht 63.0 in | Wt 97.0 lb

## 2012-11-16 DIAGNOSIS — I4891 Unspecified atrial fibrillation: Secondary | ICD-10-CM | POA: Diagnosis not present

## 2012-11-16 DIAGNOSIS — D649 Anemia, unspecified: Secondary | ICD-10-CM

## 2012-11-16 DIAGNOSIS — H1045 Other chronic allergic conjunctivitis: Secondary | ICD-10-CM

## 2012-11-16 DIAGNOSIS — D696 Thrombocytopenia, unspecified: Secondary | ICD-10-CM | POA: Insufficient documentation

## 2012-11-16 DIAGNOSIS — H1013 Acute atopic conjunctivitis, bilateral: Secondary | ICD-10-CM

## 2012-11-16 DIAGNOSIS — H1010 Acute atopic conjunctivitis, unspecified eye: Secondary | ICD-10-CM | POA: Insufficient documentation

## 2012-11-16 MED ORDER — LORATADINE 10 MG PO TABS: 10.0000 mg | ORAL_TABLET | Freq: Every day | ORAL | Status: DC

## 2012-11-16 MED ORDER — EPINASTINE HCL 0.05 % OP SOLN: 1.0000 [drp] | Freq: Two times a day (BID) | OPHTHALMIC | Status: DC

## 2012-11-16 NOTE — Assessment & Plan Note (Signed)
Will place her on Elista eye drops or generic equivalent. She will be also placed on loratidine 10 mg daily for allergies.

## 2012-11-16 NOTE — Assessment & Plan Note (Signed)
Heart rate is well controlled on current medication regimen. I am hearing crackles in the bases and some inspiratory wheezes. This may be allergy related, but I will check her for pneumonia or bronchitis with a CXR. No other changes.

## 2012-11-16 NOTE — Progress Notes (Signed)
HPI: [name] [name] is a pleasant 79 y/o patient of Dr.McDowell we are seeing for ongoing assessment and treatment of atrial fibrillation  and hypercholesterolemia. Is being seen last the patient has been placed at [facility] in August of 2013 by her son. She has excellent family support with her son who accompanies her to all of her physician appointments. The patient has been without complaint of chest pain shortness of breath or dizziness. The patient has been having some conjunctivae erythema type symptoms with runny eyes and redness but no evidence of infection. Medications are provided by skilled nursing facility, and she is compliant.          No Known Allergies  Current Outpatient Prescriptions  Medication Sig Dispense Refill  . amiodarone (PACERONE) 200 MG tablet Take 1 tablet (200 mg total) by mouth every morning.  30 tablet  5  . aspirin 81 MG EC tablet Take 81 mg by mouth every morning.       . beta carotene w/minerals (OCUVITE) tablet Take 1 tablet by mouth daily.      . Calcium Carbonate-Vit D-Min (CALTRATE 600+D PLUS) 600-400 MG-UNIT per tablet Take 1 tablet by mouth at bedtime.       . docusate sodium (COLACE) 100 MG capsule Take 100 mg by mouth at bedtime.        . donepezil (ARICEPT) 5 MG tablet Take 5 mg by mouth at bedtime.       . hydrocortisone (ANUSOL-HC) 2.5 % rectal cream Apply rectally 2 times daily for 7 days.  30 g  1  . iron polysaccharides (NIFEREX) 150 MG capsule Take 150 mg by mouth 2 (two) times daily.        Marland Kitchen levETIRAcetam (KEPPRA) 500 MG tablet Take 500 mg by mouth 2 (two) times daily.        Marland Kitchen levothyroxine (SYNTHROID, LEVOTHROID) 75 MCG tablet Take 75 mcg by mouth every morning.       . meclizine (ANTIVERT) 25 MG tablet Take 12.5 mg by mouth 3 (three) times daily as needed.       Marland Kitchen omeprazole (PRILOSEC) 20 MG capsule Take 20 mg by mouth every morning.       . potassium chloride (K-DUR,KLOR-CON) 10 MEQ tablet Take 10 mEq by mouth  daily.       . simvastatin (ZOCOR) 20 MG tablet Take 20 mg by mouth at bedtime.        No current facility-administered medications for this visit.    Past Medical History  Diagnosis Date  . Anemia     Gastritis/duodenitis  . Atrial fibrillation   . Seizure disorder   . Mental retardation   . Alzheimer's dementia   . Hyperlipidemia   . Osteoarthritis   . Hx of colonic polyps 2010    2010: Colonoscopy and polypectomy; diverticula also noted  . Pneumonia 01/1999 and  . Diverticulosis     Past Surgical History  Procedure Laterality Date  . Colonoscopy w/ polypectomy  01/18/2009     simple adenomas/moderate diverticulosis, torturous colon, normal TI, vascular prominence near dentate line.  . Esophagogastroduodenoscopy  01/18/2009    Severe chronic gastritis, peptic duodenitis  . Hemorrhoid surgery    . Cholecystectomy      YNW:GNFAOZ of systems complete and found to be negative unless listed above  PHYSICAL EXAM BP 102/64  Pulse 73  Ht 5\' 3"  (1.6 m)  Wt 97 lb 0.6 oz (44.017 kg)  BMI 17.19 kg/m2  SpO2  95%  General: Well developed, well nourished, in no acute distress Head: Eyes PERRLA, redness and erythema are noted bilaterally injection of the sl  Normal cephalic and atramatic  Lungs: Bibasilar crackles with mild inspiratory wheezes. Heart: HRIR S1 S2, without MRG.  Pulses are 2+ & equal.            No carotid bruit. No JVD.  No abdominal bruits. No femoral bruits. Abdomen: Bowel sounds are positive, abdomen soft and non-tender without masses or                  Hernia's noted. Msk:  Back normal, normal gait. Normal strength and tone for age. Extremities: No clubbing, cyanosis or edema.  DP +1 Neuro: Alert and oriented X 3. Psych:  Good affect, responds appropriately    ASSESSMENT AND PLAN

## 2012-11-16 NOTE — Progress Notes (Deleted)
Name: [name] [name]    DOB: [birth year]  Age: 79 y.o.  MR#: [id number]       PCP:  Catalina Pizza, MD      Insurance: Payor: MEDICARE / Plan: MEDICARE PART B / Product Type: *No Product type* /   CC:    Chief Complaint  Patient presents with  . Follow-up    VS Filed Vitals:   11/16/12 1056  BP: 102/64  Pulse: 73  Height: 5\' 3"  (1.6 m)  Weight: 97 lb 0.6 oz (44.017 kg)  SpO2: 95%    Weights Current Weight  11/16/12 97 lb 0.6 oz (44.017 kg)  03/24/12 98 lb 6.4 oz (44.634 kg)  02/04/12 94 lb (42.638 kg)    Blood Pressure  BP Readings from Last 3 Encounters:  11/16/12 102/64  03/24/12 88/54  02/04/12 103/59     Admit date:  (Not on file) Last encounter with RMR:  Visit date not found   Allergy Review of patient's allergies indicates no known allergies.  Current Outpatient Prescriptions  Medication Sig Dispense Refill  . amiodarone (PACERONE) 200 MG tablet Take 1 tablet (200 mg total) by mouth every morning.  30 tablet  5  . aspirin 81 MG EC tablet Take 81 mg by mouth every morning.       . beta carotene w/minerals (OCUVITE) tablet Take 1 tablet by mouth daily.      . Calcium Carbonate-Vit D-Min (CALTRATE 600+D PLUS) 600-400 MG-UNIT per tablet Take 1 tablet by mouth at bedtime.       . docusate sodium (COLACE) 100 MG capsule Take 100 mg by mouth at bedtime.        . donepezil (ARICEPT) 5 MG tablet Take 5 mg by mouth at bedtime.       . hydrocortisone (ANUSOL-HC) 2.5 % rectal cream Apply rectally 2 times daily for 7 days.  30 g  1  . iron polysaccharides (NIFEREX) 150 MG capsule Take 150 mg by mouth 2 (two) times daily.        Marland Kitchen levETIRAcetam (KEPPRA) 500 MG tablet Take 500 mg by mouth 2 (two) times daily.        Marland Kitchen levothyroxine (SYNTHROID, LEVOTHROID) 75 MCG tablet Take 75 mcg by mouth every morning.       . meclizine (ANTIVERT) 25 MG tablet Take 12.5 mg by mouth 3 (three) times daily as needed.       Marland Kitchen omeprazole (PRILOSEC) 20 MG capsule Take 20 mg by mouth every morning.        . potassium chloride (K-DUR,KLOR-CON) 10 MEQ tablet Take 10 mEq by mouth daily.       . simvastatin (ZOCOR) 20 MG tablet Take 20 mg by mouth at bedtime.        No current facility-administered medications for this visit.    Discontinued Meds:   There are no discontinued medications.  Patient Active Problem List   Diagnosis Date Noted  . Thrombocytopenia 11/16/2012  . Laboratory test 07/14/2012  . Hyperlipidemia 01/22/2011  . Anemia, normocytic normochromic-resolved 02/05/2009  . Atrial fibrillation 02/05/2009  . SEIZURE DISORDER 02/05/2009    LABS    Component Value Date/Time   NA 139 12/10/2011 1526   NA 143 09/02/2009 0655   NA 146* 08/30/2009 0353   K 3.9 12/10/2011 1526   K 3.8 09/02/2009 0655   K 3.8 DELTA CHECK NOTED 08/30/2009 0353   CL 104 12/10/2011 1526   CL 108 09/02/2009 0655   CL 102 08/30/2009 0353  CO2 24 12/10/2011 1526   CO2 30 09/02/2009 0655   CO2 36* 08/30/2009 0353   GLUCOSE 112* 12/10/2011 1526   GLUCOSE 97 09/02/2009 0655   GLUCOSE 95 08/30/2009 0353   BUN 22 12/10/2011 1526   BUN 26* 09/02/2009 0655   BUN 24* 08/30/2009 0353   CREATININE 0.94 12/10/2011 1526   CREATININE 0.90 09/02/2009 0655   CREATININE 0.98 08/30/2009 0353   CALCIUM 9.3 12/10/2011 1526   CALCIUM 8.6 09/02/2009 0655   CALCIUM 8.6 08/30/2009 0353   GFRNONAA 55* 12/10/2011 1526   GFRNONAA >60 09/02/2009 0655   GFRNONAA 55* 08/30/2009 0353   GFRAA 64* 12/10/2011 1526   GFRAA  Value: >60        The eGFR has been calculated using the MDRD equation. This calculation has not been validated in all clinical situations. eGFR's persistently <60 mL/min signify possible Chronic Kidney Disease. 09/02/2009 0655   GFRAA  Value: >60        The eGFR has been calculated using the MDRD equation. This calculation has not been validated in all clinical situations. eGFR's persistently <60 mL/min signify possible Chronic Kidney Disease. 08/30/2009 0353   CMP     Component Value Date/Time   NA 139 12/10/2011 1526   K  3.9 12/10/2011 1526   CL 104 12/10/2011 1526   CO2 24 12/10/2011 1526   GLUCOSE 112* 12/10/2011 1526   BUN 22 12/10/2011 1526   CREATININE 0.94 12/10/2011 1526   CALCIUM 9.3 12/10/2011 1526   PROT 5.0* 08/23/2009 0200   ALBUMIN 2.7* 08/23/2009 0200   AST 62* 08/23/2009 0200   ALT 67* 08/23/2009 0200   ALKPHOS 58 08/23/2009 0200   BILITOT 1.8* 08/23/2009 0200   GFRNONAA 55* 12/10/2011 1526   GFRAA 64* 12/10/2011 1526       Component Value Date/Time   WBC 4.6 12/10/2011 1526   WBC 11.3* 09/02/2009 0655   WBC 9.0 08/30/2009 0353   HGB 13.8 12/10/2011 1526   HGB 10.4* 09/02/2009 0655   HGB 10.5* 08/30/2009 0353   HCT 40.4 12/10/2011 1526   HCT 30.5* 09/02/2009 0655   HCT 30.5* 08/30/2009 0353   MCV 86.3 12/10/2011 1526   MCV 92.1 09/02/2009 0655   MCV 91.1 08/30/2009 0353    Lipid Panel  No results found for this basename: chol, trig, hdl, cholhdl, vldl, ldlcalc    ABG    Component Value Date/Time   PHART 7.529* 08/27/2009 0816   PCO2ART 37.2 08/27/2009 0816   PO2ART 160.0* 08/27/2009 0816   HCO3 30.9* 08/27/2009 0816   TCO2 32.0 08/27/2009 0816   ACIDBASEDEF 3.7* 08/25/2009 0340   O2SAT 99.7 08/27/2009 0816     Lab Results  Component Value Date   TSH 3.353 ***Test methodology is 3rd generation TSH**** 01/19/2009   BNP (last 3 results) No results found for this basename: PROBNP,  in the last 8760 hours Cardiac Panel (last 3 results) No results found for this basename: CKTOTAL, CKMB, TROPONINI, RELINDX,  in the last 72 hours  Iron/TIBC/Ferritin    Component Value Date/Time   IRON <10* 01/12/2009 1830   FERRITIN 14 01/12/2009 1830     EKG Orders placed in visit on 11/16/12  . EKG 12-LEAD     Prior Assessment and Plan Problem List as of 11/16/2012     Cardiology Problems   Atrial fibrillation   Last Assessment & Plan   11/17/2011 Office Visit Written 11/17/2011 12:18 PM by Jodelle Gross, NP     Heart  rate is well controlled. No changes in her medications. She is doing well and is stable. Will see  her in one year unless she becomes symptomatic. Refills on all medications are provided.    Hyperlipidemia   Last Assessment & Plan   01/22/2011 Office Visit Written 01/22/2011  1:31 PM by Jonelle Sidle, MD     Continue followup with Dr. Margo Aye.      Other   Anemia, normocytic normochromic-resolved   Last Assessment & Plan   11/17/2011 Office Visit Written 11/17/2011 12:18 PM by Jodelle Gross, NP     Per PCP with labs every 6 months.    SEIZURE DISORDER   Laboratory test   Thrombocytopenia       Imaging: No results found.

## 2012-11-16 NOTE — Patient Instructions (Addendum)
Your physician has recommended you make the following change in your medication:   1. Start Loratadine 10 mg daily 2. Start Elestat (Opthomolic Solution) .05% one drop in both eyes twice daily  A chest x-ray takes a picture of the organs and structures inside the chest, including the heart, lungs, and blood vessels. This test can show several things, including, whether the heart is enlarges; whether fluid is building up in the lungs; and whether pacemaker / defibrillator leads are still in place.  Your physician wants you to follow-up in: 6 months with [name], NP. You will receive a reminder letter in the mail two months in advance. If you don't receive a letter, please call our office to schedule the follow-up appointment.

## 2012-11-17 ENCOUNTER — Telehealth: Payer: Self-pay | Admitting: *Deleted

## 2012-11-17 NOTE — Telephone Encounter (Signed)
Loraine Maple with [facility] and advised pt can switch RX for elestat to visine OTC for allergies PRN, also faxed in signed instructions from [facility] per pt insurance not on formulary manually

## 2013-01-03 DIAGNOSIS — E039 Hypothyroidism, unspecified: Secondary | ICD-10-CM | POA: Diagnosis not present

## 2013-01-03 DIAGNOSIS — E785 Hyperlipidemia, unspecified: Secondary | ICD-10-CM | POA: Diagnosis not present

## 2013-01-03 DIAGNOSIS — I4891 Unspecified atrial fibrillation: Secondary | ICD-10-CM | POA: Diagnosis not present

## 2013-01-03 DIAGNOSIS — R05 Cough: Secondary | ICD-10-CM | POA: Diagnosis not present

## 2013-01-03 DIAGNOSIS — K219 Gastro-esophageal reflux disease without esophagitis: Secondary | ICD-10-CM | POA: Diagnosis not present

## 2013-01-11 ENCOUNTER — Other Ambulatory Visit: Payer: Self-pay | Admitting: *Deleted

## 2013-01-11 MED ORDER — AMIODARONE HCL 200 MG PO TABS: 200.0000 mg | ORAL_TABLET | ORAL | Status: DC

## 2013-04-18 DIAGNOSIS — K219 Gastro-esophageal reflux disease without esophagitis: Secondary | ICD-10-CM | POA: Diagnosis not present

## 2013-04-18 DIAGNOSIS — E039 Hypothyroidism, unspecified: Secondary | ICD-10-CM | POA: Diagnosis not present

## 2013-04-18 DIAGNOSIS — E785 Hyperlipidemia, unspecified: Secondary | ICD-10-CM | POA: Diagnosis not present

## 2013-04-25 DIAGNOSIS — K219 Gastro-esophageal reflux disease without esophagitis: Secondary | ICD-10-CM | POA: Diagnosis not present

## 2013-04-25 DIAGNOSIS — R05 Cough: Secondary | ICD-10-CM | POA: Diagnosis not present

## 2013-04-25 DIAGNOSIS — E039 Hypothyroidism, unspecified: Secondary | ICD-10-CM | POA: Diagnosis not present

## 2013-04-25 DIAGNOSIS — E785 Hyperlipidemia, unspecified: Secondary | ICD-10-CM | POA: Diagnosis not present

## 2013-05-31 ENCOUNTER — Encounter: Payer: Self-pay | Admitting: Adult Health

## 2013-05-31 ENCOUNTER — Ambulatory Visit (INDEPENDENT_AMBULATORY_CARE_PROVIDER_SITE_OTHER): Payer: Medicare Other | Admitting: Adult Health

## 2013-05-31 VITALS — BP 111/63 | HR 74 | Ht 60.0 in | Wt 102.0 lb

## 2013-05-31 DIAGNOSIS — E785 Hyperlipidemia, unspecified: Secondary | ICD-10-CM | POA: Diagnosis not present

## 2013-05-31 DIAGNOSIS — I4891 Unspecified atrial fibrillation: Secondary | ICD-10-CM | POA: Diagnosis not present

## 2013-05-31 MED ORDER — LORATADINE 10 MG PO TABS: 10.0000 mg | ORAL_TABLET | Freq: Every day | ORAL | Status: DC

## 2013-05-31 NOTE — Assessment & Plan Note (Signed)
She is followed by Dr. Margo Aye for labs.

## 2013-05-31 NOTE — Assessment & Plan Note (Addendum)
She is rate controlled. NO anticoagulant secondary to frequent falls.  She uses a cane for ambulation and holds on to the side rails of the SNF. She has fallen when she gets out of bed once. Feels dizzy with positional movement,.Remains on antivert.

## 2013-05-31 NOTE — Patient Instructions (Addendum)
Your physician wants you to follow-up in: 6 MONTHS       You will receive a reminder letter in the mail two months in advance. If you don't receive a letter, please call our office to schedule the follow-up appointment.  Your physician has recommended you make the following change in your medication:  1. Please take Claritin 10 mg daily to assist with allergy symptoms.

## 2013-05-31 NOTE — Progress Notes (Deleted)
Name: [name] [name]    DOB: Oct 23, 1927  Age: 79 y.o.  MR#: [id number]       PCP:  Catalina Pizza, MD      Insurance: Payor: MEDICARE / Plan: MEDICARE PART B / Product Type: *No Product type* /   CC:    Chief Complaint  Patient presents with  . Atrial Fibrillation    VS Filed Vitals:   05/31/13 1328  BP: 111/63  Pulse: 74  Height: 5' (1.524 m)  Weight: 102 lb (46.267 kg)    Weights Current Weight  05/31/13 102 lb (46.267 kg)  11/16/12 97 lb 0.6 oz (44.017 kg)  03/24/12 98 lb 6.4 oz (44.634 kg)    Blood Pressure  BP Readings from Last 3 Encounters:  05/31/13 111/63  11/16/12 102/64  03/24/12 88/54     Admit date:  (Not on file) Last encounter with RMR:  Visit date not found   Allergy Review of patient's allergies indicates no known allergies.  Current Outpatient Prescriptions  Medication Sig Dispense Refill  . amiodarone (PACERONE) 200 MG tablet Take 1 tablet (200 mg total) by mouth every morning.  30 tablet  5  . aspirin 81 MG EC tablet Take 81 mg by mouth every morning.       . beta carotene w/minerals (OCUVITE) tablet Take 1 tablet by mouth daily.      . Calcium Carbonate-Vit D-Min (CALTRATE 600+D PLUS) 600-400 MG-UNIT per tablet Take 1 tablet by mouth at bedtime.       . docusate sodium (COLACE) 100 MG capsule Take 100 mg by mouth at bedtime.        . donepezil (ARICEPT) 5 MG tablet Take 5 mg by mouth at bedtime.       Marland Kitchen Epinastine HCl (ELESTAT) 0.05 % ophthalmic solution Place 1 drop into both eyes 2 (two) times daily.  10 mL  3  . iron polysaccharides (NIFEREX) 150 MG capsule Take 150 mg by mouth 2 (two) times daily.        Marland Kitchen levETIRAcetam (KEPPRA) 500 MG tablet Take 500 mg by mouth 2 (two) times daily.        Marland Kitchen levothyroxine (SYNTHROID, LEVOTHROID) 75 MCG tablet Take 75 mcg by mouth every morning.       . loratadine (CLARITIN) 10 MG tablet Take 1 tablet (10 mg total) by mouth daily.  30 tablet  3  . meclizine (ANTIVERT) 25 MG tablet Take 12.5 mg by mouth 3 (three)  times daily as needed.       Marland Kitchen omeprazole (PRILOSEC) 20 MG capsule Take 20 mg by mouth every morning.       . potassium chloride (K-DUR,KLOR-CON) 10 MEQ tablet Take 10 mEq by mouth daily.       . simvastatin (ZOCOR) 20 MG tablet Take 20 mg by mouth at bedtime.        No current facility-administered medications for this visit.    Discontinued Meds:   There are no discontinued medications.  Patient Active Problem List   Diagnosis Date Noted  . Thrombocytopenia 11/16/2012  . Conjunctivitis, allergic 11/16/2012  . Laboratory test 07/14/2012  . Hyperlipidemia 01/22/2011  . Anemia, normocytic normochromic-resolved 02/05/2009  . Atrial fibrillation 02/05/2009  . SEIZURE DISORDER 02/05/2009    LABS    Component Value Date/Time   NA 139 12/10/2011 1526   NA 143 09/02/2009 0655   NA 146* 08/30/2009 0353   K 3.9 12/10/2011 1526   K 3.8 09/02/2009 0655   K 3.8  DELTA CHECK NOTED 08/30/2009 0353   CL 104 12/10/2011 1526   CL 108 09/02/2009 0655   CL 102 08/30/2009 0353   CO2 24 12/10/2011 1526   CO2 30 09/02/2009 0655   CO2 36* 08/30/2009 0353   GLUCOSE 112* 12/10/2011 1526   GLUCOSE 97 09/02/2009 0655   GLUCOSE 95 08/30/2009 0353   BUN 22 12/10/2011 1526   BUN 26* 09/02/2009 0655   BUN 24* 08/30/2009 0353   CREATININE 0.94 12/10/2011 1526   CREATININE 0.90 09/02/2009 0655   CREATININE 0.98 08/30/2009 0353   CALCIUM 9.3 12/10/2011 1526   CALCIUM 8.6 09/02/2009 0655   CALCIUM 8.6 08/30/2009 0353   GFRNONAA 55* 12/10/2011 1526   GFRNONAA >60 09/02/2009 0655   GFRNONAA 55* 08/30/2009 0353   GFRAA 64* 12/10/2011 1526   GFRAA  Value: >60        The eGFR has been calculated using the MDRD equation. This calculation has not been validated in all clinical situations. eGFR's persistently <60 mL/min signify possible Chronic Kidney Disease. 09/02/2009 0655   GFRAA  Value: >60        The eGFR has been calculated using the MDRD equation. This calculation has not been validated in all clinical situations. eGFR's  persistently <60 mL/min signify possible Chronic Kidney Disease. 08/30/2009 0353   CMP     Component Value Date/Time   NA 139 12/10/2011 1526   K 3.9 12/10/2011 1526   CL 104 12/10/2011 1526   CO2 24 12/10/2011 1526   GLUCOSE 112* 12/10/2011 1526   BUN 22 12/10/2011 1526   CREATININE 0.94 12/10/2011 1526   CALCIUM 9.3 12/10/2011 1526   PROT 5.0* 08/23/2009 0200   ALBUMIN 2.7* 08/23/2009 0200   AST 62* 08/23/2009 0200   ALT 67* 08/23/2009 0200   ALKPHOS 58 08/23/2009 0200   BILITOT 1.8* 08/23/2009 0200   GFRNONAA 55* 12/10/2011 1526   GFRAA 64* 12/10/2011 1526       Component Value Date/Time   WBC 4.6 12/10/2011 1526   WBC 11.3* 09/02/2009 0655   WBC 9.0 08/30/2009 0353   HGB 13.8 12/10/2011 1526   HGB 10.4* 09/02/2009 0655   HGB 10.5* 08/30/2009 0353   HCT 40.4 12/10/2011 1526   HCT 30.5* 09/02/2009 0655   HCT 30.5* 08/30/2009 0353   MCV 86.3 12/10/2011 1526   MCV 92.1 09/02/2009 0655   MCV 91.1 08/30/2009 0353    Lipid Panel  No results found for this basename: chol, trig, hdl, cholhdl, vldl, ldlcalc    ABG    Component Value Date/Time   PHART 7.529* 08/27/2009 0816   PCO2ART 37.2 08/27/2009 0816   PO2ART 160.0* 08/27/2009 0816   HCO3 30.9* 08/27/2009 0816   TCO2 32.0 08/27/2009 0816   ACIDBASEDEF 3.7* 08/25/2009 0340   O2SAT 99.7 08/27/2009 0816     Lab Results  Component Value Date   TSH 3.353 ***Test methodology is 3rd generation TSH**** 01/19/2009   BNP (last 3 results) No results found for this basename: PROBNP,  in the last 8760 hours Cardiac Panel (last 3 results) No results found for this basename: CKTOTAL, CKMB, TROPONINI, RELINDX,  in the last 72 hours  Iron/TIBC/Ferritin    Component Value Date/Time   IRON <10* 01/12/2009 1830   FERRITIN 14 01/12/2009 1830     EKG Orders placed in visit on 11/16/12  . EKG 12-LEAD     Prior Assessment and Plan Problem List as of 05/31/2013     Cardiovascular and Mediastinum   Atrial  fibrillation   Last Assessment & Plan   11/16/2012 Office Visit  Written 11/16/2012 11:53 AM by Jodelle Gross, NP     Heart rate is well controlled on current medication regimen. I am hearing crackles in the bases and some inspiratory wheezes. This may be allergy related, but I will check her for pneumonia or bronchitis with a CXR. No other changes.      Hematopoietic and Hemostatic   Thrombocytopenia     Other   Anemia, normocytic normochromic-resolved   Last Assessment & Plan   11/17/2011 Office Visit Written 11/17/2011 12:18 PM by Jodelle Gross, NP     Per PCP with labs every 6 months.    SEIZURE DISORDER   Hyperlipidemia   Last Assessment & Plan   01/22/2011 Office Visit Written 01/22/2011  1:31 PM by Jonelle Sidle, MD     Continue followup with Dr. Margo Aye.    Laboratory test   Conjunctivitis, allergic   Last Assessment & Plan   11/16/2012 Office Visit Written 11/16/2012 11:55 AM by Jodelle Gross, NP     Will place her on Elista eye drops or generic equivalent. She will be also placed on loratidine 10 mg daily for allergies.        Imaging: No results found.

## 2013-05-31 NOTE — Progress Notes (Signed)
HPI: [name] [name] is a pleasant 79 year old patient of Dr. Diona Browner her following for ongoing assessment and management of atrial fibrillation with history of hypercholesterolemia. Patient was last seen in the office in May of 2014. This time the patient was without complaint had been recently placed in [facility] skilled nursing facility.   She is complaining of a cough. Allergy symptoms. She has cough medications prn,  No Known Allergies  Current Outpatient Prescriptions  Medication Sig Dispense Refill  . amiodarone (PACERONE) 200 MG tablet Take 1 tablet (200 mg total) by mouth every morning.  30 tablet  5  . aspirin 81 MG EC tablet Take 81 mg by mouth every morning.       . beta carotene w/minerals (OCUVITE) tablet Take 1 tablet by mouth daily.      . Calcium Carbonate-Vit D-Min (CALTRATE 600+D PLUS) 600-400 MG-UNIT per tablet Take 1 tablet by mouth at bedtime.       . docusate sodium (COLACE) 100 MG capsule Take 100 mg by mouth at bedtime.        . donepezil (ARICEPT) 5 MG tablet Take 5 mg by mouth at bedtime.       Marland Kitchen Epinastine HCl (ELESTAT) 0.05 % ophthalmic solution Place 1 drop into both eyes 2 (two) times daily.  10 mL  3  . iron polysaccharides (NIFEREX) 150 MG capsule Take 150 mg by mouth 2 (two) times daily.        Marland Kitchen levETIRAcetam (KEPPRA) 500 MG tablet Take 500 mg by mouth 2 (two) times daily.        Marland Kitchen levothyroxine (SYNTHROID, LEVOTHROID) 75 MCG tablet Take 75 mcg by mouth every morning.       . loratadine (CLARITIN) 10 MG tablet Take 1 tablet (10 mg total) by mouth daily.  30 tablet  3  . meclizine (ANTIVERT) 25 MG tablet Take 12.5 mg by mouth 3 (three) times daily as needed.       Marland Kitchen omeprazole (PRILOSEC) 20 MG capsule Take 20 mg by mouth every morning.       . potassium chloride (K-DUR,KLOR-CON) 10 MEQ tablet Take 10 mEq by mouth daily.       . simvastatin (ZOCOR) 20 MG tablet Take 20 mg by mouth at bedtime.        No current facility-administered medications for this  visit.    Past Medical History  Diagnosis Date  . Anemia     Gastritis/duodenitis  . Atrial fibrillation   . Seizure disorder   . Mental retardation   . Alzheimer's dementia   . Hyperlipidemia   . Osteoarthritis   . Hx of colonic polyps 2010    2010: Colonoscopy and polypectomy; diverticula also noted  . Pneumonia 01/1999 and  . Diverticulosis     Past Surgical History  Procedure Laterality Date  . Colonoscopy w/ polypectomy  01/18/2009     simple adenomas/moderate diverticulosis, torturous colon, normal TI, vascular prominence near dentate line.  . Esophagogastroduodenoscopy  01/18/2009    Severe chronic gastritis, peptic duodenitis  . Hemorrhoid surgery    . Cholecystectomy      JXB:JYNWGN of systems complete and found to be negative unless listed above  PHYSICAL EXAM BP 111/63  Pulse 74  Ht 5' (1.524 m)  Wt 102 lb (46.267 kg)  BMI 19.92 kg/m2  General: Well developed, well nourished, in no acute distress Head: Eyes PERRLA, No xanthomas.   Normal cephalic and atramatic  Lungs: Clear bilaterally to auscultation and percussion.  Heart: HRRR S1 S2, soft systolic murmur.  Pulses are 2+ & equal.            No carotid bruit. No JVD.  No abdominal bruits. No femoral bruits. Abdomen: Bowel sounds are positive, abdomen soft and non-tender without masses or                  Hernia's noted. Msk:  Back normal, normal gait. Normal strength and tone for age. Extremities: No clubbing, cyanosis or edema.  DP +1 Neuro: Alert and oriented X 3. Very HOH. Psych:  Good affect, responds appropriately    ASSESSMENT AND PLAN

## 2013-05-31 NOTE — Assessment & Plan Note (Signed)
I have restarted the claritin as I believe sinus allergies and post-nasal drip may be causing frequent coughing.  This was a part of her regimen and but was not found on MAR.

## 2013-06-26 ENCOUNTER — Other Ambulatory Visit: Payer: Self-pay | Admitting: Adult Health

## 2013-07-25 DIAGNOSIS — K219 Gastro-esophageal reflux disease without esophagitis: Secondary | ICD-10-CM | POA: Diagnosis not present

## 2013-07-25 DIAGNOSIS — R05 Cough: Secondary | ICD-10-CM | POA: Diagnosis not present

## 2013-07-25 DIAGNOSIS — R059 Cough, unspecified: Secondary | ICD-10-CM | POA: Diagnosis not present

## 2013-08-11 DIAGNOSIS — E039 Hypothyroidism, unspecified: Secondary | ICD-10-CM | POA: Diagnosis not present

## 2013-08-11 DIAGNOSIS — R059 Cough, unspecified: Secondary | ICD-10-CM | POA: Diagnosis not present

## 2013-08-11 DIAGNOSIS — R05 Cough: Secondary | ICD-10-CM | POA: Diagnosis not present

## 2013-09-11 DIAGNOSIS — R05 Cough: Secondary | ICD-10-CM | POA: Diagnosis not present

## 2013-09-11 DIAGNOSIS — R059 Cough, unspecified: Secondary | ICD-10-CM | POA: Diagnosis not present

## 2013-11-15 ENCOUNTER — Emergency Department (HOSPITAL_COMMUNITY)
Admission: EM | Admit: 2013-11-15 | Discharge: 2013-11-15 | Disposition: A | Discharge status: Home / Self Care | Payer: Medicare Other | Attending: Emergency Medicine | Admitting: Emergency Medicine

## 2013-11-15 ENCOUNTER — Emergency Department (HOSPITAL_COMMUNITY): Payer: Medicare Other

## 2013-11-15 ENCOUNTER — Encounter (HOSPITAL_COMMUNITY): Payer: Self-pay | Admitting: Emergency Medicine

## 2013-11-15 DIAGNOSIS — S0083XA Contusion of other part of head, initial encounter: Secondary | ICD-10-CM | POA: Diagnosis not present

## 2013-11-15 DIAGNOSIS — Z23 Encounter for immunization: Secondary | ICD-10-CM | POA: Diagnosis not present

## 2013-11-15 DIAGNOSIS — S0180XA Unspecified open wound of other part of head, initial encounter: Secondary | ICD-10-CM | POA: Insufficient documentation

## 2013-11-15 DIAGNOSIS — G309 Alzheimer's disease, unspecified: Secondary | ICD-10-CM | POA: Insufficient documentation

## 2013-11-15 DIAGNOSIS — I4891 Unspecified atrial fibrillation: Secondary | ICD-10-CM | POA: Insufficient documentation

## 2013-11-15 DIAGNOSIS — Z7982 Long term (current) use of aspirin: Secondary | ICD-10-CM | POA: Diagnosis not present

## 2013-11-15 DIAGNOSIS — G40909 Epilepsy, unspecified, not intractable, without status epilepticus: Secondary | ICD-10-CM | POA: Diagnosis not present

## 2013-11-15 DIAGNOSIS — S0003XA Contusion of scalp, initial encounter: Secondary | ICD-10-CM | POA: Diagnosis not present

## 2013-11-15 DIAGNOSIS — Z860100 Personal history of colon polyps, unspecified: Secondary | ICD-10-CM | POA: Insufficient documentation

## 2013-11-15 DIAGNOSIS — Y9389 Activity, other specified: Secondary | ICD-10-CM | POA: Insufficient documentation

## 2013-11-15 DIAGNOSIS — T1490XA Injury, unspecified, initial encounter: Secondary | ICD-10-CM | POA: Diagnosis not present

## 2013-11-15 DIAGNOSIS — Y929 Unspecified place or not applicable: Secondary | ICD-10-CM | POA: Insufficient documentation

## 2013-11-15 DIAGNOSIS — W1809XA Striking against other object with subsequent fall, initial encounter: Secondary | ICD-10-CM | POA: Insufficient documentation

## 2013-11-15 DIAGNOSIS — S0993XA Unspecified injury of face, initial encounter: Secondary | ICD-10-CM | POA: Diagnosis not present

## 2013-11-15 DIAGNOSIS — Z8719 Personal history of other diseases of the digestive system: Secondary | ICD-10-CM | POA: Insufficient documentation

## 2013-11-15 DIAGNOSIS — F0280 Dementia in other diseases classified elsewhere without behavioral disturbance: Secondary | ICD-10-CM | POA: Diagnosis not present

## 2013-11-15 DIAGNOSIS — E785 Hyperlipidemia, unspecified: Secondary | ICD-10-CM | POA: Insufficient documentation

## 2013-11-15 DIAGNOSIS — IMO0002 Reserved for concepts with insufficient information to code with codable children: Secondary | ICD-10-CM | POA: Insufficient documentation

## 2013-11-15 DIAGNOSIS — Z8701 Personal history of pneumonia (recurrent): Secondary | ICD-10-CM | POA: Insufficient documentation

## 2013-11-15 DIAGNOSIS — Z8739 Personal history of other diseases of the musculoskeletal system and connective tissue: Secondary | ICD-10-CM | POA: Diagnosis not present

## 2013-11-15 DIAGNOSIS — S0990XA Unspecified injury of head, initial encounter: Secondary | ICD-10-CM | POA: Diagnosis not present

## 2013-11-15 DIAGNOSIS — M542 Cervicalgia: Secondary | ICD-10-CM | POA: Diagnosis not present

## 2013-11-15 DIAGNOSIS — Z79899 Other long term (current) drug therapy: Secondary | ICD-10-CM | POA: Diagnosis not present

## 2013-11-15 DIAGNOSIS — S0181XA Laceration without foreign body of other part of head, initial encounter: Secondary | ICD-10-CM

## 2013-11-15 DIAGNOSIS — D649 Anemia, unspecified: Secondary | ICD-10-CM | POA: Diagnosis not present

## 2013-11-15 DIAGNOSIS — W06XXXA Fall from bed, initial encounter: Secondary | ICD-10-CM | POA: Insufficient documentation

## 2013-11-15 DIAGNOSIS — S1093XA Contusion of unspecified part of neck, initial encounter: Secondary | ICD-10-CM | POA: Diagnosis not present

## 2013-11-15 MED ORDER — LIDOCAINE-EPINEPHRINE (PF) 1 %-1:200000 IJ SOLN
30.0000 mL | Freq: Once | INTRAMUSCULAR | Status: AC
  Administered 2013-11-15: 30 mL via INTRADERMAL

## 2013-11-15 MED ORDER — TETANUS-DIPHTH-ACELL PERTUSSIS 5-2.5-18.5 LF-MCG/0.5 IM SUSP
0.5000 mL | Freq: Once | INTRAMUSCULAR | Status: AC
Start: 2013-11-15 — End: 2013-11-15
  Administered 2013-11-15: 0.5 mL via INTRAMUSCULAR

## 2013-11-15 MED ORDER — TETANUS-DIPHTH-ACELL PERTUSSIS 5-2.5-18.5 LF-MCG/0.5 IM SUSP
INTRAMUSCULAR | Status: AC
  Administered 2013-11-15: 0.5 mL via INTRAMUSCULAR
  Filled 2013-11-15: qty 0.5

## 2013-11-15 NOTE — ED Notes (Addendum)
Brought by [facility] by EMS for fall. Per pt walking back from breakfast and went to sit and chair and fell forward hitting dresser per pt. Pt denies dizziness prior to fall. Pt alert. nad noted. Laceration to middle of forehead present. Bleeding controlled. No loc

## 2013-11-15 NOTE — ED Provider Notes (Signed)
CSN: [id number]     Arrival date & time 11/15/13  6295 History  This chart was scribed for Benny Lennert, MD by Ardelia Mems, ED Scribe. This patient was seen in room APA16A/APA16A and the patient's care was started at 10:06 AM.   Chief Complaint  Patient presents with  . Fall    Patient is a 80 y.o. female presenting with fall. The history is provided by the patient. No language interpreter was used.  Fall This is a new problem. The current episode started less than 1 hour ago. The problem occurs rarely. The problem has not changed since onset.Associated symptoms include headaches. Pertinent negatives include no chest pain, no abdominal pain and no shortness of breath. Nothing aggravates the symptoms. Nothing relieves the symptoms. She has tried nothing for the symptoms.    HPI Comments: [name] [name] is a 80 y.o. Female with a history of Alzheimer's dementia. Afib, seizure disorder and MR brought by EMS from a local nursing facilty to the Emergency Department complaining of a fall that occurred earlier today. She states that she fell off of her bed, hitting her forehead on a dresser. She denies any dizziness or other symptoms preceding the fall. She sustained a laceration to her forehead area at the time of the fall. Bleeding to the laceration is controlled. She is complaining of constant, mild pain to the area of the laceration. She denies any other pain or symptoms.  Past Medical History  Diagnosis Date  . Anemia     Gastritis/duodenitis  . Atrial fibrillation   . Seizure disorder   . Mental retardation   . Alzheimer's dementia   . Hyperlipidemia   . Osteoarthritis   . Hx of colonic polyps 2010    2010: Colonoscopy and polypectomy; diverticula also noted  . Pneumonia 01/1999 and  . Diverticulosis    Past Surgical History  Procedure Laterality Date  . Colonoscopy w/ polypectomy  01/18/2009     simple adenomas/moderate diverticulosis, torturous colon, normal TI, vascular  prominence near dentate line.  . Esophagogastroduodenoscopy  01/18/2009    Severe chronic gastritis, peptic duodenitis  . Hemorrhoid surgery    . Cholecystectomy     No family history on file. History  Substance Use Topics  . Smoking status: Never Smoker   . Smokeless tobacco: Never Used     Comment: tobacco use - no  . Alcohol Use: No   OB History   Grav Para Term Preterm Abortions TAB SAB Ect Mult Living                 Review of Systems  Constitutional: Negative for appetite change and fatigue.  HENT: Negative for congestion, ear discharge and sinus pressure.   Eyes: Negative for discharge.  Respiratory: Negative for cough and shortness of breath.   Cardiovascular: Negative for chest pain.  Gastrointestinal: Negative for abdominal pain and diarrhea.  Genitourinary: Negative for frequency and hematuria.  Musculoskeletal: Negative for back pain.  Skin: Positive for wound. Negative for rash.  Neurological: Positive for headaches. Negative for dizziness and seizures.  Psychiatric/Behavioral: Negative for hallucinations.    Allergies  Review of patient's allergies indicates no known allergies.  Home Medications   Prior to Admission medications   Medication Sig Start Date End Date Taking? Authorizing Provider  acetaminophen (TYLENOL) 500 MG tablet Take 1,000 mg by mouth every 4 (four) hours as needed for headache.   Yes Historical Provider, MD  amiodarone (PACERONE) 200 MG tablet TAKE 1 TABLET BY  MOUTH ONCE DAILY. 06/26/13  Yes Jodelle Gross, NP  aspirin 81 MG EC tablet Take 81 mg by mouth every morning.    Yes Historical Provider, MD  beclomethasone (QVAR) 40 MCG/ACT inhaler Inhale 2 puffs into the lungs 2 (two) times daily. For 3 weeks. Starting on 11/03/13   Yes Historical Provider, MD  beta carotene w/minerals (OCUVITE) tablet Take 1 tablet by mouth daily.   Yes Historical Provider, MD  Calcium Carbonate-Vit D-Min (CALTRATE 600+D PLUS) 600-400 MG-UNIT per tablet Take 1  tablet by mouth at bedtime.    Yes Historical Provider, MD  docusate sodium (COLACE) 100 MG capsule Take 100 mg by mouth at bedtime.     Yes Historical Provider, MD  donepezil (ARICEPT) 5 MG tablet Take 5 mg by mouth at bedtime.    Yes Historical Provider, MD  Epinastine HCl 0.05 % ophthalmic solution Place 1 drop into both eyes 2 (two) times daily as needed (allergic conjunctivitis). 11/16/12  Yes Jodelle Gross, NP  iron polysaccharides (NIFEREX) 150 MG capsule Take 150 mg by mouth 2 (two) times daily.     Yes Historical Provider, MD  levETIRAcetam (KEPPRA) 500 MG tablet Take 500 mg by mouth 2 (two) times daily.     Yes Historical Provider, MD  levocetirizine (XYZAL) 5 MG tablet Take 5 mg by mouth every evening.   Yes Historical Provider, MD  levothyroxine (SYNTHROID, LEVOTHROID) 75 MCG tablet Take 75 mcg by mouth every morning.    Yes Historical Provider, MD  meclizine (ANTIVERT) 25 MG tablet Take 12.5 mg by mouth 3 (three) times daily as needed for dizziness.    Yes Historical Provider, MD  omeprazole (PRILOSEC) 20 MG capsule Take 20 mg by mouth 2 (two) times daily before a meal.    Yes Historical Provider, MD  potassium chloride (K-DUR,KLOR-CON) 10 MEQ tablet Take 10 mEq by mouth daily.    Yes Historical Provider, MD  simvastatin (ZOCOR) 20 MG tablet Take 20 mg by mouth daily.    Yes Historical Provider, MD   Triage Vitals: BP 110/90  Pulse 69  Temp(Src) 98.6 F (37 C) (Oral)  Resp 18  SpO2 100%  Physical Exam  Nursing note and vitals reviewed. Constitutional: She is oriented to person, place, and time. She appears well-developed.  HENT:  Head: Normocephalic.  3 cm laceration to forehead   Eyes: Conjunctivae and EOM are normal. No scleral icterus.  Neck: Neck supple. No thyromegaly present.  Cardiovascular: Normal rate and regular rhythm.  Exam reveals no gallop and no friction rub.   No murmur heard. Pulmonary/Chest: No stridor. She has no wheezes. She has no rales. She  exhibits no tenderness.  Abdominal: She exhibits no distension. There is no tenderness. There is no rebound.  Musculoskeletal: Normal range of motion. She exhibits no edema.  Lymphadenopathy:    She has no cervical adenopathy.  Neurological: She is oriented to person, place, and time. She exhibits normal muscle tone. Coordination normal.  Skin: No rash noted. No erythema.  Psychiatric: She has a normal mood and affect. Her behavior is normal.    ED Course  LACERATION REPAIR Date/Time: 11/15/2013 12:57 PM Performed by: Adanna Zuckerman L Authorized by: Lennon Richins L Comments: 3 cm lac to forehead.  Area numbed with 1% lido with epi.  Cleaned with betadine.  6,   5-0 nylon sutures used to close the  laceration   (including critical care time)  DIAGNOSTIC STUDIES: Oxygen Saturation is 100% on RA, normal by my  interpretation.    COORDINATION OF CARE: 10:09 AM- Pt advised of plan for treatment and pt agrees.  Labs Review Labs Reviewed - No data to display  Imaging Review No results found.   EKG Interpretation None      MDM   Final diagnoses:  None   The chart was scribed for me under my direct supervision.  I personally performed the history, physical, and medical decision making and all procedures in the evaluation of this patient.Benny Lennert.   Allegra Cerniglia L Kamil Hanigan, MD 11/15/13 [phone]

## 2013-11-15 NOTE — ED Notes (Signed)
nad noted prior to dc. Dc instructions reviewed with caregiver from [facility]. Voiced understanding. Clean drsg applied to forehead as per Dr. Estell Harpin

## 2013-11-15 NOTE — Discharge Instructions (Signed)
Clean laceration 2 times a day with soap and water.  Sutures out in 5-6 days

## 2013-11-24 DIAGNOSIS — Z4800 Encounter for change or removal of nonsurgical wound dressing: Secondary | ICD-10-CM | POA: Diagnosis not present

## 2013-11-24 DIAGNOSIS — I998 Other disorder of circulatory system: Secondary | ICD-10-CM | POA: Diagnosis not present

## 2013-11-24 DIAGNOSIS — S0180XA Unspecified open wound of other part of head, initial encounter: Secondary | ICD-10-CM | POA: Diagnosis not present

## 2013-12-05 DIAGNOSIS — H1045 Other chronic allergic conjunctivitis: Secondary | ICD-10-CM | POA: Diagnosis not present

## 2014-01-09 ENCOUNTER — Other Ambulatory Visit (HOSPITAL_COMMUNITY): Payer: Self-pay | Admitting: Internal Medicine

## 2014-01-09 DIAGNOSIS — R1310 Dysphagia, unspecified: Secondary | ICD-10-CM

## 2014-01-09 DIAGNOSIS — M7989 Other specified soft tissue disorders: Secondary | ICD-10-CM | POA: Diagnosis not present

## 2014-01-09 DIAGNOSIS — R609 Edema, unspecified: Secondary | ICD-10-CM | POA: Diagnosis not present

## 2014-01-09 DIAGNOSIS — R05 Cough: Secondary | ICD-10-CM | POA: Diagnosis not present

## 2014-01-09 DIAGNOSIS — R059 Cough, unspecified: Secondary | ICD-10-CM | POA: Diagnosis not present

## 2014-01-09 DIAGNOSIS — I69921 Dysphasia following unspecified cerebrovascular disease: Secondary | ICD-10-CM | POA: Diagnosis not present

## 2014-01-09 DIAGNOSIS — R0602 Shortness of breath: Secondary | ICD-10-CM | POA: Diagnosis not present

## 2014-01-11 ENCOUNTER — Ambulatory Visit (HOSPITAL_COMMUNITY)
Admission: RE | Admit: 2014-01-11 | Discharge: 2014-01-11 | Disposition: A | Discharge status: Home / Self Care | Payer: Medicare Other | Source: Ambulatory Visit | Attending: Internal Medicine | Admitting: Internal Medicine

## 2014-01-11 DIAGNOSIS — K2289 Other specified disease of esophagus: Secondary | ICD-10-CM | POA: Diagnosis not present

## 2014-01-11 DIAGNOSIS — K224 Dyskinesia of esophagus: Secondary | ICD-10-CM | POA: Insufficient documentation

## 2014-01-11 DIAGNOSIS — K449 Diaphragmatic hernia without obstruction or gangrene: Secondary | ICD-10-CM | POA: Insufficient documentation

## 2014-01-11 DIAGNOSIS — R1310 Dysphagia, unspecified: Secondary | ICD-10-CM | POA: Diagnosis not present

## 2014-01-11 DIAGNOSIS — K228 Other specified diseases of esophagus: Secondary | ICD-10-CM | POA: Diagnosis not present

## 2014-02-02 DIAGNOSIS — E785 Hyperlipidemia, unspecified: Secondary | ICD-10-CM | POA: Diagnosis not present

## 2014-02-02 DIAGNOSIS — E039 Hypothyroidism, unspecified: Secondary | ICD-10-CM | POA: Diagnosis not present

## 2014-02-06 DIAGNOSIS — E785 Hyperlipidemia, unspecified: Secondary | ICD-10-CM | POA: Diagnosis not present

## 2014-02-06 DIAGNOSIS — R1310 Dysphagia, unspecified: Secondary | ICD-10-CM | POA: Diagnosis not present

## 2014-02-06 DIAGNOSIS — E039 Hypothyroidism, unspecified: Secondary | ICD-10-CM | POA: Diagnosis not present

## 2014-02-19 ENCOUNTER — Other Ambulatory Visit: Payer: Self-pay | Admitting: Adult Health

## 2014-03-19 ENCOUNTER — Other Ambulatory Visit: Payer: Self-pay | Admitting: Adult Health

## 2014-04-17 ENCOUNTER — Other Ambulatory Visit: Payer: Self-pay | Admitting: Adult Health

## 2014-05-04 DIAGNOSIS — J206 Acute bronchitis due to rhinovirus: Secondary | ICD-10-CM | POA: Diagnosis not present

## 2014-05-04 DIAGNOSIS — H6121 Impacted cerumen, right ear: Secondary | ICD-10-CM | POA: Diagnosis not present

## 2014-05-16 ENCOUNTER — Other Ambulatory Visit: Payer: Self-pay | Admitting: Cardiology

## 2014-05-16 ENCOUNTER — Other Ambulatory Visit: Payer: Self-pay | Admitting: Adult Health

## 2014-05-16 MED ORDER — AMIODARONE HCL 200 MG PO TABS: 200.0000 mg | ORAL_TABLET | Freq: Every day | ORAL | Status: DC

## 2014-05-16 NOTE — Telephone Encounter (Signed)
Received fax refill request  Rx # [id number] Medication:  Amiodarone HCL 200 mg tab Qty 30 Sig:  Take one tablet by mouth once daily Physician:  Diona BrownerMcDowell

## 2014-06-06 ENCOUNTER — Other Ambulatory Visit: Payer: Self-pay | Admitting: Adult Health

## 2014-06-20 DIAGNOSIS — H6121 Impacted cerumen, right ear: Secondary | ICD-10-CM | POA: Diagnosis not present

## 2014-06-21 ENCOUNTER — Other Ambulatory Visit: Payer: Self-pay | Admitting: Adult Health

## 2014-06-30 ENCOUNTER — Other Ambulatory Visit: Payer: Self-pay | Admitting: Adult Health

## 2014-07-16 ENCOUNTER — Emergency Department (HOSPITAL_COMMUNITY)
Admission: EM | Admit: 2014-07-16 | Discharge: 2014-07-16 | Disposition: A | Discharge status: Home / Self Care | Payer: Medicare Other | Attending: Emergency Medicine | Admitting: Emergency Medicine

## 2014-07-16 ENCOUNTER — Emergency Department (HOSPITAL_COMMUNITY): Payer: Medicare Other

## 2014-07-16 ENCOUNTER — Encounter (HOSPITAL_COMMUNITY): Payer: Self-pay | Admitting: Emergency Medicine

## 2014-07-16 DIAGNOSIS — M47816 Spondylosis without myelopathy or radiculopathy, lumbar region: Secondary | ICD-10-CM | POA: Diagnosis not present

## 2014-07-16 DIAGNOSIS — M5136 Other intervertebral disc degeneration, lumbar region: Secondary | ICD-10-CM | POA: Diagnosis not present

## 2014-07-16 DIAGNOSIS — E785 Hyperlipidemia, unspecified: Secondary | ICD-10-CM | POA: Insufficient documentation

## 2014-07-16 DIAGNOSIS — G309 Alzheimer's disease, unspecified: Secondary | ICD-10-CM | POA: Insufficient documentation

## 2014-07-16 DIAGNOSIS — M47896 Other spondylosis, lumbar region: Secondary | ICD-10-CM | POA: Diagnosis not present

## 2014-07-16 DIAGNOSIS — Z7952 Long term (current) use of systemic steroids: Secondary | ICD-10-CM | POA: Diagnosis not present

## 2014-07-16 DIAGNOSIS — F79 Unspecified intellectual disabilities: Secondary | ICD-10-CM | POA: Diagnosis not present

## 2014-07-16 DIAGNOSIS — G8929 Other chronic pain: Secondary | ICD-10-CM | POA: Insufficient documentation

## 2014-07-16 DIAGNOSIS — K5900 Constipation, unspecified: Secondary | ICD-10-CM | POA: Diagnosis not present

## 2014-07-16 DIAGNOSIS — Z79899 Other long term (current) drug therapy: Secondary | ICD-10-CM | POA: Diagnosis not present

## 2014-07-16 DIAGNOSIS — Z86010 Personal history of colonic polyps: Secondary | ICD-10-CM | POA: Diagnosis not present

## 2014-07-16 DIAGNOSIS — M549 Dorsalgia, unspecified: Secondary | ICD-10-CM

## 2014-07-16 DIAGNOSIS — Z8679 Personal history of other diseases of the circulatory system: Secondary | ICD-10-CM | POA: Diagnosis not present

## 2014-07-16 DIAGNOSIS — F0281 Dementia in other diseases classified elsewhere with behavioral disturbance: Secondary | ICD-10-CM | POA: Insufficient documentation

## 2014-07-16 DIAGNOSIS — D649 Anemia, unspecified: Secondary | ICD-10-CM | POA: Insufficient documentation

## 2014-07-16 DIAGNOSIS — R0902 Hypoxemia: Secondary | ICD-10-CM | POA: Diagnosis not present

## 2014-07-16 DIAGNOSIS — M545 Low back pain: Secondary | ICD-10-CM | POA: Diagnosis present

## 2014-07-16 DIAGNOSIS — Z8701 Personal history of pneumonia (recurrent): Secondary | ICD-10-CM | POA: Insufficient documentation

## 2014-07-16 MED ORDER — HYDROCODONE-ACETAMINOPHEN 5-325 MG PO TABS: 1.0000 | ORAL_TABLET | ORAL | Status: DC | PRN

## 2014-07-16 MED ORDER — PREDNISONE 20 MG PO TABS: 20.0000 mg | ORAL_TABLET | Freq: Two times a day (BID) | ORAL | Status: DC

## 2014-07-16 MED ORDER — HYDROCODONE-ACETAMINOPHEN 5-325 MG PO TABS
1.0000 | ORAL_TABLET | Freq: Once | ORAL | Status: AC
  Administered 2014-07-16: 1 via ORAL
  Filled 2014-07-16: qty 1

## 2014-07-16 NOTE — ED Notes (Addendum)
pt c/o lower back pain x 2 days ago. denies fall/injury. Pt denies genitourinary sx, states last bowel movement this am but "it was hard".

## 2014-07-16 NOTE — Discharge Instructions (Signed)
You have back pain appears to be from arthritis of your lower back. Try to get help when you need to walk. Use heat on the sore area 3 or 4 times a day. Follow-up with your primary care doctor for checkup, next week.   Back Pain, Adult Low back pain is very common. About 1 in 5 people have back pain.The cause of low back pain is rarely dangerous. The pain often gets better over time.About half of people with a sudden onset of back pain feel better in just 2 weeks. About 8 in 10 people feel better by 6 weeks.  CAUSES Some common causes of back pain include:  Strain of the muscles or ligaments supporting the spine.  Wear and tear (degeneration) of the spinal discs.  Arthritis.  Direct injury to the back. DIAGNOSIS Most of the time, the direct cause of low back pain is not known.However, back pain can be treated effectively even when the exact cause of the pain is unknown.Answering your caregiver's questions about your overall health and symptoms is one of the most accurate ways to make sure the cause of your pain is not dangerous. If your caregiver needs more information, he or she may order lab work or imaging tests (X-rays or MRIs).However, even if imaging tests show changes in your back, this usually does not require surgery. HOME CARE INSTRUCTIONS For many people, back pain returns.Since low back pain is rarely dangerous, it is often a condition that people can learn to [facility] their own.   Remain active. It is stressful on the back to sit or stand in one place. Do not sit, drive, or stand in one place for more than 30 minutes at a time. Take short walks on level surfaces as soon as pain allows.Try to increase the length of time you walk each day.  Do not stay in bed.Resting more than 1 or 2 days can delay your recovery.  Do not avoid exercise or work.Your body is made to move.It is not dangerous to be active, even though your back may hurt.Your back will likely heal faster  if you return to being active before your pain is gone.  Pay attention to your body when you bend and lift. Many people have less discomfortwhen lifting if they bend their knees, keep the load close to their bodies,and avoid twisting. Often, the most comfortable positions are those that put less stress on your recovering back.  Find a comfortable position to sleep. Use a firm mattress and lie on your side with your knees slightly bent. If you lie on your back, put a pillow under your knees.  Only take over-the-counter or prescription medicines as directed by your caregiver. Over-the-counter medicines to reduce pain and inflammation are often the most helpful.Your caregiver may prescribe muscle relaxant drugs.These medicines help dull your pain so you can more quickly return to your normal activities and healthy exercise.  Put ice on the injured area.  Put ice in a plastic bag.  Place a towel between your skin and the bag.  Leave the ice on for 15-20 minutes, 03-04 times a day for the first 2 to 3 days. After that, ice and heat may be alternated to reduce pain and spasms.  Ask your caregiver about trying back exercises and gentle massage. This may be of some benefit.  Avoid feeling anxious or stressed.Stress increases muscle tension and can worsen back pain.It is important to recognize when you are anxious or stressed and learn ways to  manage it.Exercise is a great option. SEEK MEDICAL CARE IF:  You have pain that is not relieved with rest or medicine.  You have pain that does not improve in 1 week.  You have new symptoms.  You are generally not feeling well. SEEK IMMEDIATE MEDICAL CARE IF:   You have pain that radiates from your back into your legs.  You develop new bowel or bladder control problems.  You have unusual weakness or numbness in your arms or legs.  You develop nausea or vomiting.  You develop abdominal pain.  You feel faint. Document Released: 06/08/2005  Document Revised: 12/08/2011 Document Reviewed: 10/10/2013 [facility] Patient Information 2015 [employer], [place]. This information is not intended to replace advice given to you by your health care provider. Make sure you discuss any questions you have with your health care provider.

## 2014-07-16 NOTE — ED Provider Notes (Signed)
CSN: [id number]     Arrival date & time 07/16/14  1114 History  This chart was scribed for [name] Melter, MD by Tonye Royalty, ED Scribe. This patient was seen in room APA07/AP[facility]07 and the patient's care was started at 11:35 AM.    Chief Complaint  Patient presents with  . Back Pain   The history is provided by the patient. No language interpreter was used.    HPI Comments: [name] [name] is a 81 y.o. female resident at assisted living facility who presents to the Emergency Department complaining of low back pain with onset 3 days ago.  She denies falling but thinks she might have strained it twisting in bed. She notes a hard bowel movement this morning. Per cousin, she has no history of back problems. He states she is taking Tylenol only for pain without improvement. She ambulates with a cane without significantly hunching over. She denies vomiting, fever, or appetite change.  PCP: Catalina Pizza, MD    Past Medical History  Diagnosis Date  . Anemia     Gastritis/duodenitis  . Atrial fibrillation   . Seizure disorder   . Mental retardation   . Alzheimer's dementia   . Hyperlipidemia   . Osteoarthritis   . Hx of colonic polyps 2010    2010: Colonoscopy and polypectomy; diverticula also noted  . Pneumonia 01/1999 and  . Diverticulosis    Past Surgical History  Procedure Laterality Date  . Colonoscopy w/ polypectomy  01/18/2009     simple adenomas/moderate diverticulosis, torturous colon, normal TI, vascular prominence near dentate line.  . Esophagogastroduodenoscopy  01/18/2009    Severe chronic gastritis, peptic duodenitis  . Hemorrhoid surgery    . Cholecystectomy     No family history on file. History  Substance Use Topics  . Smoking status: Never Smoker   . Smokeless tobacco: Never Used     Comment: tobacco use - no  . Alcohol Use: No   OB History    No data available     Review of Systems  Constitutional: Negative for fever and appetite change.  Gastrointestinal:  Positive for constipation. Negative for vomiting.  Musculoskeletal: Positive for back pain.  All other systems reviewed and are negative.     Allergies  Review of patient's allergies indicates no known allergies.  Home Medications   Prior to Admission medications   Medication Sig Start Date End Date Taking? Authorizing Provider  acetaminophen (TYLENOL) 500 MG tablet Take 1,000 mg by mouth every 4 (four) hours as needed for headache.    Historical Provider, MD  amiodarone (PACERONE) 200 MG tablet TAKE 1 TABLET BY MOUTH ONCE DAILY. 07/02/14   Jodelle Gross, NP  aspirin 81 MG EC tablet Take 81 mg by mouth every morning.     Historical Provider, MD  beclomethasone (QVAR) 40 MCG/ACT inhaler Inhale 2 puffs into the lungs 2 (two) times daily. For 3 weeks. Starting on 11/03/13    Historical Provider, MD  beta carotene w/minerals (OCUVITE) tablet Take 1 tablet by mouth daily.    Historical Provider, MD  Calcium Carbonate-Vit D-Min (CALTRATE 600+D PLUS) 600-400 MG-UNIT per tablet Take 1 tablet by mouth at bedtime.     Historical Provider, MD  docusate sodium (COLACE) 100 MG capsule Take 100 mg by mouth at bedtime.      Historical Provider, MD  donepezil (ARICEPT) 5 MG tablet Take 5 mg by mouth at bedtime.     Historical Provider, MD  Epinastine HCl 0.05 %  ophthalmic solution Place 1 drop into both eyes 2 (two) times daily as needed (allergic conjunctivitis). 11/16/12   Jodelle Gross, NP  iron polysaccharides (NIFEREX) 150 MG capsule Take 150 mg by mouth 2 (two) times daily.      Historical Provider, MD  levETIRAcetam (KEPPRA) 500 MG tablet Take 500 mg by mouth 2 (two) times daily.      Historical Provider, MD  levocetirizine (XYZAL) 5 MG tablet Take 5 mg by mouth every evening.    Historical Provider, MD  levothyroxine (SYNTHROID, LEVOTHROID) 75 MCG tablet Take 75 mcg by mouth every morning.     Historical Provider, MD  meclizine (ANTIVERT) 25 MG tablet Take 12.5 mg by mouth 3 (three) times  daily as needed for dizziness.     Historical Provider, MD  omeprazole (PRILOSEC) 20 MG capsule Take 20 mg by mouth 2 (two) times daily before a meal.     Historical Provider, MD  potassium chloride (K-DUR,KLOR-CON) 10 MEQ tablet Take 10 mEq by mouth daily.     Historical Provider, MD  simvastatin (ZOCOR) 20 MG tablet Take 20 mg by mouth daily.     Historical Provider, MD   BP 114/65 mmHg  Pulse 71  Temp(Src) 98 F (36.7 C) (Oral)  Resp 16  Ht 5' (1.524 m)  Wt 100 lb (45.36 kg)  BMI 19.53 kg/m2  SpO2 92% Physical Exam  Constitutional: She is oriented to person, place, and time. She appears well-developed and well-nourished.  HENT:  Head: Normocephalic and atraumatic.  Eyes: Conjunctivae and EOM are normal. Pupils are equal, round, and reactive to light.  Neck: Normal range of motion and phonation normal. Neck supple.  Cardiovascular: Normal rate and regular rhythm.   Pulmonary/Chest: Effort normal and breath sounds normal. She exhibits no tenderness.  Abdominal: Soft. She exhibits no distension. There is no tenderness. There is no guarding.  Musculoskeletal: Normal range of motion.  Tender in left lumbar area Negative straight leg raise bilaterally  Neurological: She is alert and oriented to person, place, and time. She exhibits normal muscle tone.  Skin: Skin is warm and dry.  Psychiatric: She has a normal mood and affect. Her behavior is normal. Judgment and thought content normal.  Nursing note and vitals reviewed.   ED Course  Procedures (including critical care time)  DIAGNOSTIC STUDIES: Oxygen Saturation is 99% on room air, normal by my interpretation.     COORDINATION OF CARE: 11:38 AM Discussed treatment plan with patient at beside, the patient agrees with the plan and has no further questions at this time.  Medications  HYDROcodone-acetaminophen (NORCO/VICODIN) 5-325 MG per tablet 1 tablet (1 tablet Oral Given 07/16/14 1217)    Patient Vitals for the past 24  hrs:  BP Temp Temp src Pulse Resp SpO2 Height Weight  07/16/14 1302 - - - 74 - 100 % - -  07/16/14 1300 115/60 mmHg - - - - - - -  07/16/14 1200 (!) 97/49 mmHg - - 73 - 96 % - -  07/16/14 1115 - - - - - - 5' (1.524 m) 100 lb (45.36 kg)  07/16/14 1114 114/65 mmHg 98 F (36.7 C) Oral 71 16 92 % - -    At discharge- Reevaluation with update and discussion. After initial assessment and treatment, an updated evaluation reveals she is able to name easily with minimal assistance.  Family members updated on findings.  All questions answered.[name] [name]   Labs Review Labs Reviewed - No data to display  Imaging Review Dg Lumbar Spine Complete  07/16/2014   CLINICAL DATA:  Back pain for 2 days  EXAM: LUMBAR SPINE - COMPLETE 4+ VIEW  COMPARISON:  01/20/2009  FINDINGS: Osteopenia. Levoscoliosis is present at the apex at L3. Visualization of the vertebral bodies is limited due to scoliosis. No obvious new compression deformity. There are anterior osteophytes at L1-2 and L2-3. Moderate disc space narrowing at these levels. Moderate narrowing at L3-4. Moderate narrowing at the L5-S1 disc.  IMPRESSION: Chronic changes.  No acute bony pathology.   Electronically Signed   By: Maryclare BeanArt  Hoss M.D.   On: 07/16/2014 13:11     EKG Interpretation None      MDM   Final diagnoses:  Back pain  Spondylosis of lumbar region without myelopathy or radiculopathy    Back pain with decreased mobility secondary to degenerative joint disease.  She has improved with oral narcotic in the ED.  There is no indication for further evaluation, admission or other interventions at this time.  Nursing Notes Reviewed/ Care Coordinated Applicable Imaging Reviewed Interpretation of Laboratory Data incorporated into ED treatment  The patient appears reasonably screened and/or stabilized for discharge and I doubt any other medical condition or other [facility] requiring further screening, evaluation, or treatment in the ED at this time  prior to discharge.  Plan: Home Medications- Norco, Prednisone; Home Treatments- rest, heat; return here if the recommended treatment, does not improve the symptoms; Recommended follow up- PCP prn  I personally performed the services described in this documentation, which was scribed in my presence. The recorded information has been reviewed and is accurate.     [name] MelterElliott L Collyn Selk, MD 07/16/14 [id number]

## 2014-07-16 NOTE — ED Notes (Signed)
Patient was able to ambulate to the bathroom and back with very little assistance.

## 2014-07-19 ENCOUNTER — Other Ambulatory Visit (HOSPITAL_COMMUNITY): Payer: Self-pay | Admitting: Internal Medicine

## 2014-07-19 DIAGNOSIS — R1310 Dysphagia, unspecified: Secondary | ICD-10-CM

## 2014-07-19 DIAGNOSIS — R4702 Dysphasia: Secondary | ICD-10-CM | POA: Diagnosis not present

## 2014-07-19 DIAGNOSIS — N390 Urinary tract infection, site not specified: Secondary | ICD-10-CM | POA: Diagnosis not present

## 2014-07-19 DIAGNOSIS — M545 Low back pain: Secondary | ICD-10-CM | POA: Diagnosis not present

## 2014-07-20 ENCOUNTER — Ambulatory Visit (HOSPITAL_COMMUNITY)
Admission: RE | Admit: 2014-07-20 | Discharge: 2014-07-20 | Disposition: A | Discharge status: Home / Self Care | Payer: Medicare Other | Source: Ambulatory Visit | Attending: Internal Medicine | Admitting: Internal Medicine

## 2014-07-20 DIAGNOSIS — M545 Low back pain: Secondary | ICD-10-CM | POA: Diagnosis not present

## 2014-07-20 DIAGNOSIS — K224 Dyskinesia of esophagus: Secondary | ICD-10-CM | POA: Diagnosis not present

## 2014-07-20 DIAGNOSIS — R1110 Vomiting, unspecified: Secondary | ICD-10-CM | POA: Diagnosis not present

## 2014-07-20 DIAGNOSIS — R1310 Dysphagia, unspecified: Secondary | ICD-10-CM | POA: Insufficient documentation

## 2014-07-20 DIAGNOSIS — K225 Diverticulum of esophagus, acquired: Secondary | ICD-10-CM | POA: Diagnosis not present

## 2014-08-14 DIAGNOSIS — E782 Mixed hyperlipidemia: Secondary | ICD-10-CM | POA: Diagnosis not present

## 2014-08-14 DIAGNOSIS — E039 Hypothyroidism, unspecified: Secondary | ICD-10-CM | POA: Diagnosis not present

## 2014-08-16 DIAGNOSIS — I482 Chronic atrial fibrillation: Secondary | ICD-10-CM | POA: Diagnosis not present

## 2014-08-16 DIAGNOSIS — E782 Mixed hyperlipidemia: Secondary | ICD-10-CM | POA: Diagnosis not present

## 2014-08-16 DIAGNOSIS — E039 Hypothyroidism, unspecified: Secondary | ICD-10-CM | POA: Diagnosis not present

## 2014-08-16 DIAGNOSIS — Z6820 Body mass index (BMI) 20.0-20.9, adult: Secondary | ICD-10-CM | POA: Diagnosis not present

## 2014-11-30 DIAGNOSIS — H6123 Impacted cerumen, bilateral: Secondary | ICD-10-CM | POA: Diagnosis not present

## 2014-12-05 DIAGNOSIS — K219 Gastro-esophageal reflux disease without esophagitis: Secondary | ICD-10-CM | POA: Diagnosis not present

## 2014-12-05 DIAGNOSIS — Z9181 History of falling: Secondary | ICD-10-CM | POA: Diagnosis not present

## 2014-12-05 DIAGNOSIS — F0390 Unspecified dementia without behavioral disturbance: Secondary | ICD-10-CM | POA: Diagnosis not present

## 2014-12-05 DIAGNOSIS — M25559 Pain in unspecified hip: Secondary | ICD-10-CM | POA: Diagnosis not present

## 2014-12-05 DIAGNOSIS — Z79891 Long term (current) use of opiate analgesic: Secondary | ICD-10-CM | POA: Diagnosis not present

## 2014-12-05 DIAGNOSIS — R569 Unspecified convulsions: Secondary | ICD-10-CM | POA: Diagnosis not present

## 2014-12-05 DIAGNOSIS — E039 Hypothyroidism, unspecified: Secondary | ICD-10-CM | POA: Diagnosis not present

## 2014-12-05 DIAGNOSIS — M810 Age-related osteoporosis without current pathological fracture: Secondary | ICD-10-CM | POA: Diagnosis not present

## 2014-12-05 DIAGNOSIS — I482 Chronic atrial fibrillation: Secondary | ICD-10-CM | POA: Diagnosis not present

## 2014-12-05 DIAGNOSIS — M545 Low back pain: Secondary | ICD-10-CM | POA: Diagnosis not present

## 2014-12-05 DIAGNOSIS — E782 Mixed hyperlipidemia: Secondary | ICD-10-CM | POA: Diagnosis not present

## 2014-12-05 DIAGNOSIS — Z7982 Long term (current) use of aspirin: Secondary | ICD-10-CM | POA: Diagnosis not present

## 2014-12-06 DIAGNOSIS — E039 Hypothyroidism, unspecified: Secondary | ICD-10-CM | POA: Diagnosis not present

## 2014-12-06 DIAGNOSIS — E782 Mixed hyperlipidemia: Secondary | ICD-10-CM | POA: Diagnosis not present

## 2014-12-10 DIAGNOSIS — I482 Chronic atrial fibrillation: Secondary | ICD-10-CM | POA: Diagnosis not present

## 2014-12-10 DIAGNOSIS — M25559 Pain in unspecified hip: Secondary | ICD-10-CM | POA: Diagnosis not present

## 2014-12-10 DIAGNOSIS — K219 Gastro-esophageal reflux disease without esophagitis: Secondary | ICD-10-CM | POA: Diagnosis not present

## 2014-12-10 DIAGNOSIS — F0390 Unspecified dementia without behavioral disturbance: Secondary | ICD-10-CM | POA: Diagnosis not present

## 2014-12-10 DIAGNOSIS — M545 Low back pain: Secondary | ICD-10-CM | POA: Diagnosis not present

## 2014-12-10 DIAGNOSIS — M810 Age-related osteoporosis without current pathological fracture: Secondary | ICD-10-CM | POA: Diagnosis not present

## 2014-12-11 DIAGNOSIS — E039 Hypothyroidism, unspecified: Secondary | ICD-10-CM | POA: Diagnosis not present

## 2014-12-11 DIAGNOSIS — I482 Chronic atrial fibrillation: Secondary | ICD-10-CM | POA: Diagnosis not present

## 2014-12-11 DIAGNOSIS — E782 Mixed hyperlipidemia: Secondary | ICD-10-CM | POA: Diagnosis not present

## 2014-12-12 DIAGNOSIS — I482 Chronic atrial fibrillation: Secondary | ICD-10-CM | POA: Diagnosis not present

## 2014-12-12 DIAGNOSIS — K219 Gastro-esophageal reflux disease without esophagitis: Secondary | ICD-10-CM | POA: Diagnosis not present

## 2014-12-12 DIAGNOSIS — F0390 Unspecified dementia without behavioral disturbance: Secondary | ICD-10-CM | POA: Diagnosis not present

## 2014-12-12 DIAGNOSIS — M810 Age-related osteoporosis without current pathological fracture: Secondary | ICD-10-CM | POA: Diagnosis not present

## 2014-12-12 DIAGNOSIS — M545 Low back pain: Secondary | ICD-10-CM | POA: Diagnosis not present

## 2014-12-12 DIAGNOSIS — M25559 Pain in unspecified hip: Secondary | ICD-10-CM | POA: Diagnosis not present

## 2014-12-17 DIAGNOSIS — I482 Chronic atrial fibrillation: Secondary | ICD-10-CM | POA: Diagnosis not present

## 2014-12-17 DIAGNOSIS — M25559 Pain in unspecified hip: Secondary | ICD-10-CM | POA: Diagnosis not present

## 2014-12-17 DIAGNOSIS — M810 Age-related osteoporosis without current pathological fracture: Secondary | ICD-10-CM | POA: Diagnosis not present

## 2014-12-17 DIAGNOSIS — F0390 Unspecified dementia without behavioral disturbance: Secondary | ICD-10-CM | POA: Diagnosis not present

## 2014-12-17 DIAGNOSIS — M545 Low back pain: Secondary | ICD-10-CM | POA: Diagnosis not present

## 2014-12-17 DIAGNOSIS — K219 Gastro-esophageal reflux disease without esophagitis: Secondary | ICD-10-CM | POA: Diagnosis not present

## 2014-12-19 DIAGNOSIS — I482 Chronic atrial fibrillation: Secondary | ICD-10-CM | POA: Diagnosis not present

## 2014-12-19 DIAGNOSIS — M810 Age-related osteoporosis without current pathological fracture: Secondary | ICD-10-CM | POA: Diagnosis not present

## 2014-12-19 DIAGNOSIS — M545 Low back pain: Secondary | ICD-10-CM | POA: Diagnosis not present

## 2014-12-19 DIAGNOSIS — M25559 Pain in unspecified hip: Secondary | ICD-10-CM | POA: Diagnosis not present

## 2014-12-19 DIAGNOSIS — K219 Gastro-esophageal reflux disease without esophagitis: Secondary | ICD-10-CM | POA: Diagnosis not present

## 2014-12-19 DIAGNOSIS — F0390 Unspecified dementia without behavioral disturbance: Secondary | ICD-10-CM | POA: Diagnosis not present

## 2014-12-26 DIAGNOSIS — M25559 Pain in unspecified hip: Secondary | ICD-10-CM | POA: Diagnosis not present

## 2014-12-26 DIAGNOSIS — I482 Chronic atrial fibrillation: Secondary | ICD-10-CM | POA: Diagnosis not present

## 2014-12-26 DIAGNOSIS — M545 Low back pain: Secondary | ICD-10-CM | POA: Diagnosis not present

## 2014-12-26 DIAGNOSIS — F0390 Unspecified dementia without behavioral disturbance: Secondary | ICD-10-CM | POA: Diagnosis not present

## 2014-12-26 DIAGNOSIS — K219 Gastro-esophageal reflux disease without esophagitis: Secondary | ICD-10-CM | POA: Diagnosis not present

## 2014-12-26 DIAGNOSIS — M810 Age-related osteoporosis without current pathological fracture: Secondary | ICD-10-CM | POA: Diagnosis not present

## 2014-12-27 DIAGNOSIS — M545 Low back pain: Secondary | ICD-10-CM | POA: Diagnosis not present

## 2014-12-27 DIAGNOSIS — I482 Chronic atrial fibrillation: Secondary | ICD-10-CM | POA: Diagnosis not present

## 2014-12-27 DIAGNOSIS — K219 Gastro-esophageal reflux disease without esophagitis: Secondary | ICD-10-CM | POA: Diagnosis not present

## 2014-12-27 DIAGNOSIS — M25559 Pain in unspecified hip: Secondary | ICD-10-CM | POA: Diagnosis not present

## 2014-12-27 DIAGNOSIS — F0390 Unspecified dementia without behavioral disturbance: Secondary | ICD-10-CM | POA: Diagnosis not present

## 2014-12-27 DIAGNOSIS — M810 Age-related osteoporosis without current pathological fracture: Secondary | ICD-10-CM | POA: Diagnosis not present

## 2014-12-31 DIAGNOSIS — F0390 Unspecified dementia without behavioral disturbance: Secondary | ICD-10-CM | POA: Diagnosis not present

## 2014-12-31 DIAGNOSIS — I482 Chronic atrial fibrillation: Secondary | ICD-10-CM | POA: Diagnosis not present

## 2014-12-31 DIAGNOSIS — K219 Gastro-esophageal reflux disease without esophagitis: Secondary | ICD-10-CM | POA: Diagnosis not present

## 2014-12-31 DIAGNOSIS — M545 Low back pain: Secondary | ICD-10-CM | POA: Diagnosis not present

## 2014-12-31 DIAGNOSIS — M25559 Pain in unspecified hip: Secondary | ICD-10-CM | POA: Diagnosis not present

## 2014-12-31 DIAGNOSIS — M810 Age-related osteoporosis without current pathological fracture: Secondary | ICD-10-CM | POA: Diagnosis not present

## 2015-01-07 DIAGNOSIS — M545 Low back pain: Secondary | ICD-10-CM | POA: Diagnosis not present

## 2015-01-07 DIAGNOSIS — I482 Chronic atrial fibrillation: Secondary | ICD-10-CM | POA: Diagnosis not present

## 2015-01-07 DIAGNOSIS — M25559 Pain in unspecified hip: Secondary | ICD-10-CM | POA: Diagnosis not present

## 2015-01-07 DIAGNOSIS — M810 Age-related osteoporosis without current pathological fracture: Secondary | ICD-10-CM | POA: Diagnosis not present

## 2015-01-07 DIAGNOSIS — K219 Gastro-esophageal reflux disease without esophagitis: Secondary | ICD-10-CM | POA: Diagnosis not present

## 2015-01-07 DIAGNOSIS — F0390 Unspecified dementia without behavioral disturbance: Secondary | ICD-10-CM | POA: Diagnosis not present

## 2015-01-09 DIAGNOSIS — K219 Gastro-esophageal reflux disease without esophagitis: Secondary | ICD-10-CM | POA: Diagnosis not present

## 2015-01-09 DIAGNOSIS — M545 Low back pain: Secondary | ICD-10-CM | POA: Diagnosis not present

## 2015-01-09 DIAGNOSIS — I482 Chronic atrial fibrillation: Secondary | ICD-10-CM | POA: Diagnosis not present

## 2015-01-09 DIAGNOSIS — M810 Age-related osteoporosis without current pathological fracture: Secondary | ICD-10-CM | POA: Diagnosis not present

## 2015-01-09 DIAGNOSIS — M25559 Pain in unspecified hip: Secondary | ICD-10-CM | POA: Diagnosis not present

## 2015-01-09 DIAGNOSIS — F0390 Unspecified dementia without behavioral disturbance: Secondary | ICD-10-CM | POA: Diagnosis not present

## 2015-01-14 DIAGNOSIS — F0390 Unspecified dementia without behavioral disturbance: Secondary | ICD-10-CM | POA: Diagnosis not present

## 2015-01-14 DIAGNOSIS — I482 Chronic atrial fibrillation: Secondary | ICD-10-CM | POA: Diagnosis not present

## 2015-01-14 DIAGNOSIS — K219 Gastro-esophageal reflux disease without esophagitis: Secondary | ICD-10-CM | POA: Diagnosis not present

## 2015-01-14 DIAGNOSIS — M810 Age-related osteoporosis without current pathological fracture: Secondary | ICD-10-CM | POA: Diagnosis not present

## 2015-01-14 DIAGNOSIS — M545 Low back pain: Secondary | ICD-10-CM | POA: Diagnosis not present

## 2015-01-14 DIAGNOSIS — M25559 Pain in unspecified hip: Secondary | ICD-10-CM | POA: Diagnosis not present

## 2015-01-16 DIAGNOSIS — M25559 Pain in unspecified hip: Secondary | ICD-10-CM | POA: Diagnosis not present

## 2015-01-16 DIAGNOSIS — I482 Chronic atrial fibrillation: Secondary | ICD-10-CM | POA: Diagnosis not present

## 2015-01-16 DIAGNOSIS — F0390 Unspecified dementia without behavioral disturbance: Secondary | ICD-10-CM | POA: Diagnosis not present

## 2015-01-16 DIAGNOSIS — M545 Low back pain: Secondary | ICD-10-CM | POA: Diagnosis not present

## 2015-01-16 DIAGNOSIS — M810 Age-related osteoporosis without current pathological fracture: Secondary | ICD-10-CM | POA: Diagnosis not present

## 2015-01-16 DIAGNOSIS — K219 Gastro-esophageal reflux disease without esophagitis: Secondary | ICD-10-CM | POA: Diagnosis not present

## 2015-01-21 DIAGNOSIS — I482 Chronic atrial fibrillation: Secondary | ICD-10-CM | POA: Diagnosis not present

## 2015-01-21 DIAGNOSIS — K219 Gastro-esophageal reflux disease without esophagitis: Secondary | ICD-10-CM | POA: Diagnosis not present

## 2015-01-21 DIAGNOSIS — F0390 Unspecified dementia without behavioral disturbance: Secondary | ICD-10-CM | POA: Diagnosis not present

## 2015-01-21 DIAGNOSIS — M810 Age-related osteoporosis without current pathological fracture: Secondary | ICD-10-CM | POA: Diagnosis not present

## 2015-01-21 DIAGNOSIS — M25559 Pain in unspecified hip: Secondary | ICD-10-CM | POA: Diagnosis not present

## 2015-01-21 DIAGNOSIS — M545 Low back pain: Secondary | ICD-10-CM | POA: Diagnosis not present

## 2015-01-23 DIAGNOSIS — F0390 Unspecified dementia without behavioral disturbance: Secondary | ICD-10-CM | POA: Diagnosis not present

## 2015-01-23 DIAGNOSIS — K219 Gastro-esophageal reflux disease without esophagitis: Secondary | ICD-10-CM | POA: Diagnosis not present

## 2015-01-23 DIAGNOSIS — M545 Low back pain: Secondary | ICD-10-CM | POA: Diagnosis not present

## 2015-01-23 DIAGNOSIS — I482 Chronic atrial fibrillation: Secondary | ICD-10-CM | POA: Diagnosis not present

## 2015-01-23 DIAGNOSIS — M810 Age-related osteoporosis without current pathological fracture: Secondary | ICD-10-CM | POA: Diagnosis not present

## 2015-01-23 DIAGNOSIS — M25559 Pain in unspecified hip: Secondary | ICD-10-CM | POA: Diagnosis not present

## 2015-01-28 DIAGNOSIS — M545 Low back pain: Secondary | ICD-10-CM | POA: Diagnosis not present

## 2015-01-28 DIAGNOSIS — K219 Gastro-esophageal reflux disease without esophagitis: Secondary | ICD-10-CM | POA: Diagnosis not present

## 2015-01-28 DIAGNOSIS — I482 Chronic atrial fibrillation: Secondary | ICD-10-CM | POA: Diagnosis not present

## 2015-01-28 DIAGNOSIS — M25559 Pain in unspecified hip: Secondary | ICD-10-CM | POA: Diagnosis not present

## 2015-01-28 DIAGNOSIS — F0390 Unspecified dementia without behavioral disturbance: Secondary | ICD-10-CM | POA: Diagnosis not present

## 2015-01-28 DIAGNOSIS — M810 Age-related osteoporosis without current pathological fracture: Secondary | ICD-10-CM | POA: Diagnosis not present

## 2015-01-30 DIAGNOSIS — M25559 Pain in unspecified hip: Secondary | ICD-10-CM | POA: Diagnosis not present

## 2015-01-30 DIAGNOSIS — M810 Age-related osteoporosis without current pathological fracture: Secondary | ICD-10-CM | POA: Diagnosis not present

## 2015-01-30 DIAGNOSIS — K219 Gastro-esophageal reflux disease without esophagitis: Secondary | ICD-10-CM | POA: Diagnosis not present

## 2015-01-30 DIAGNOSIS — I482 Chronic atrial fibrillation: Secondary | ICD-10-CM | POA: Diagnosis not present

## 2015-01-30 DIAGNOSIS — M545 Low back pain: Secondary | ICD-10-CM | POA: Diagnosis not present

## 2015-01-30 DIAGNOSIS — F0390 Unspecified dementia without behavioral disturbance: Secondary | ICD-10-CM | POA: Diagnosis not present

## 2015-02-04 DIAGNOSIS — H40013 Open angle with borderline findings, low risk, bilateral: Secondary | ICD-10-CM | POA: Diagnosis not present

## 2015-05-02 DIAGNOSIS — I482 Chronic atrial fibrillation: Secondary | ICD-10-CM | POA: Diagnosis not present

## 2015-05-02 DIAGNOSIS — M810 Age-related osteoporosis without current pathological fracture: Secondary | ICD-10-CM | POA: Diagnosis not present

## 2015-05-02 DIAGNOSIS — R2689 Other abnormalities of gait and mobility: Secondary | ICD-10-CM | POA: Diagnosis not present

## 2015-05-02 DIAGNOSIS — R569 Unspecified convulsions: Secondary | ICD-10-CM | POA: Diagnosis not present

## 2015-05-02 DIAGNOSIS — F0390 Unspecified dementia without behavioral disturbance: Secondary | ICD-10-CM | POA: Diagnosis not present

## 2015-05-02 DIAGNOSIS — M6281 Muscle weakness (generalized): Secondary | ICD-10-CM | POA: Diagnosis not present

## 2015-05-03 DIAGNOSIS — M810 Age-related osteoporosis without current pathological fracture: Secondary | ICD-10-CM | POA: Diagnosis not present

## 2015-05-03 DIAGNOSIS — I482 Chronic atrial fibrillation: Secondary | ICD-10-CM | POA: Diagnosis not present

## 2015-05-03 DIAGNOSIS — M6281 Muscle weakness (generalized): Secondary | ICD-10-CM | POA: Diagnosis not present

## 2015-05-03 DIAGNOSIS — F0390 Unspecified dementia without behavioral disturbance: Secondary | ICD-10-CM | POA: Diagnosis not present

## 2015-05-03 DIAGNOSIS — R569 Unspecified convulsions: Secondary | ICD-10-CM | POA: Diagnosis not present

## 2015-05-03 DIAGNOSIS — R2689 Other abnormalities of gait and mobility: Secondary | ICD-10-CM | POA: Diagnosis not present

## 2015-05-06 DIAGNOSIS — R2689 Other abnormalities of gait and mobility: Secondary | ICD-10-CM | POA: Diagnosis not present

## 2015-05-06 DIAGNOSIS — I482 Chronic atrial fibrillation: Secondary | ICD-10-CM | POA: Diagnosis not present

## 2015-05-06 DIAGNOSIS — F0390 Unspecified dementia without behavioral disturbance: Secondary | ICD-10-CM | POA: Diagnosis not present

## 2015-05-06 DIAGNOSIS — M6281 Muscle weakness (generalized): Secondary | ICD-10-CM | POA: Diagnosis not present

## 2015-05-06 DIAGNOSIS — R569 Unspecified convulsions: Secondary | ICD-10-CM | POA: Diagnosis not present

## 2015-05-06 DIAGNOSIS — M810 Age-related osteoporosis without current pathological fracture: Secondary | ICD-10-CM | POA: Diagnosis not present

## 2015-05-07 DIAGNOSIS — L989 Disorder of the skin and subcutaneous tissue, unspecified: Secondary | ICD-10-CM | POA: Diagnosis not present

## 2015-05-07 DIAGNOSIS — Z9181 History of falling: Secondary | ICD-10-CM | POA: Diagnosis not present

## 2015-05-08 DIAGNOSIS — F0390 Unspecified dementia without behavioral disturbance: Secondary | ICD-10-CM | POA: Diagnosis not present

## 2015-05-08 DIAGNOSIS — I482 Chronic atrial fibrillation: Secondary | ICD-10-CM | POA: Diagnosis not present

## 2015-05-08 DIAGNOSIS — R569 Unspecified convulsions: Secondary | ICD-10-CM | POA: Diagnosis not present

## 2015-05-08 DIAGNOSIS — R2689 Other abnormalities of gait and mobility: Secondary | ICD-10-CM | POA: Diagnosis not present

## 2015-05-08 DIAGNOSIS — M6281 Muscle weakness (generalized): Secondary | ICD-10-CM | POA: Diagnosis not present

## 2015-05-08 DIAGNOSIS — M810 Age-related osteoporosis without current pathological fracture: Secondary | ICD-10-CM | POA: Diagnosis not present

## 2015-05-13 DIAGNOSIS — R569 Unspecified convulsions: Secondary | ICD-10-CM | POA: Diagnosis not present

## 2015-05-13 DIAGNOSIS — M6281 Muscle weakness (generalized): Secondary | ICD-10-CM | POA: Diagnosis not present

## 2015-05-13 DIAGNOSIS — M810 Age-related osteoporosis without current pathological fracture: Secondary | ICD-10-CM | POA: Diagnosis not present

## 2015-05-13 DIAGNOSIS — F0390 Unspecified dementia without behavioral disturbance: Secondary | ICD-10-CM | POA: Diagnosis not present

## 2015-05-13 DIAGNOSIS — R2689 Other abnormalities of gait and mobility: Secondary | ICD-10-CM | POA: Diagnosis not present

## 2015-05-13 DIAGNOSIS — I482 Chronic atrial fibrillation: Secondary | ICD-10-CM | POA: Diagnosis not present

## 2015-05-15 DIAGNOSIS — R569 Unspecified convulsions: Secondary | ICD-10-CM | POA: Diagnosis not present

## 2015-05-15 DIAGNOSIS — M6281 Muscle weakness (generalized): Secondary | ICD-10-CM | POA: Diagnosis not present

## 2015-05-15 DIAGNOSIS — R2689 Other abnormalities of gait and mobility: Secondary | ICD-10-CM | POA: Diagnosis not present

## 2015-05-15 DIAGNOSIS — M810 Age-related osteoporosis without current pathological fracture: Secondary | ICD-10-CM | POA: Diagnosis not present

## 2015-05-15 DIAGNOSIS — I482 Chronic atrial fibrillation: Secondary | ICD-10-CM | POA: Diagnosis not present

## 2015-05-15 DIAGNOSIS — F0390 Unspecified dementia without behavioral disturbance: Secondary | ICD-10-CM | POA: Diagnosis not present

## 2015-05-20 DIAGNOSIS — R569 Unspecified convulsions: Secondary | ICD-10-CM | POA: Diagnosis not present

## 2015-05-20 DIAGNOSIS — I482 Chronic atrial fibrillation: Secondary | ICD-10-CM | POA: Diagnosis not present

## 2015-05-20 DIAGNOSIS — R2689 Other abnormalities of gait and mobility: Secondary | ICD-10-CM | POA: Diagnosis not present

## 2015-05-20 DIAGNOSIS — M6281 Muscle weakness (generalized): Secondary | ICD-10-CM | POA: Diagnosis not present

## 2015-05-20 DIAGNOSIS — M810 Age-related osteoporosis without current pathological fracture: Secondary | ICD-10-CM | POA: Diagnosis not present

## 2015-05-20 DIAGNOSIS — F0390 Unspecified dementia without behavioral disturbance: Secondary | ICD-10-CM | POA: Diagnosis not present

## 2015-05-21 DIAGNOSIS — R2689 Other abnormalities of gait and mobility: Secondary | ICD-10-CM | POA: Diagnosis not present

## 2015-05-21 DIAGNOSIS — M810 Age-related osteoporosis without current pathological fracture: Secondary | ICD-10-CM | POA: Diagnosis not present

## 2015-05-21 DIAGNOSIS — F0390 Unspecified dementia without behavioral disturbance: Secondary | ICD-10-CM | POA: Diagnosis not present

## 2015-05-21 DIAGNOSIS — I482 Chronic atrial fibrillation: Secondary | ICD-10-CM | POA: Diagnosis not present

## 2015-05-21 DIAGNOSIS — M6281 Muscle weakness (generalized): Secondary | ICD-10-CM | POA: Diagnosis not present

## 2015-05-21 DIAGNOSIS — R569 Unspecified convulsions: Secondary | ICD-10-CM | POA: Diagnosis not present

## 2015-05-27 DIAGNOSIS — I482 Chronic atrial fibrillation: Secondary | ICD-10-CM | POA: Diagnosis not present

## 2015-05-27 DIAGNOSIS — R2689 Other abnormalities of gait and mobility: Secondary | ICD-10-CM | POA: Diagnosis not present

## 2015-05-27 DIAGNOSIS — M810 Age-related osteoporosis without current pathological fracture: Secondary | ICD-10-CM | POA: Diagnosis not present

## 2015-05-27 DIAGNOSIS — M6281 Muscle weakness (generalized): Secondary | ICD-10-CM | POA: Diagnosis not present

## 2015-05-27 DIAGNOSIS — R569 Unspecified convulsions: Secondary | ICD-10-CM | POA: Diagnosis not present

## 2015-05-27 DIAGNOSIS — F0390 Unspecified dementia without behavioral disturbance: Secondary | ICD-10-CM | POA: Diagnosis not present

## 2015-05-29 DIAGNOSIS — M810 Age-related osteoporosis without current pathological fracture: Secondary | ICD-10-CM | POA: Diagnosis not present

## 2015-05-29 DIAGNOSIS — R2689 Other abnormalities of gait and mobility: Secondary | ICD-10-CM | POA: Diagnosis not present

## 2015-05-29 DIAGNOSIS — F0390 Unspecified dementia without behavioral disturbance: Secondary | ICD-10-CM | POA: Diagnosis not present

## 2015-05-29 DIAGNOSIS — M6281 Muscle weakness (generalized): Secondary | ICD-10-CM | POA: Diagnosis not present

## 2015-05-29 DIAGNOSIS — I482 Chronic atrial fibrillation: Secondary | ICD-10-CM | POA: Diagnosis not present

## 2015-05-29 DIAGNOSIS — R569 Unspecified convulsions: Secondary | ICD-10-CM | POA: Diagnosis not present

## 2015-06-03 DIAGNOSIS — F0390 Unspecified dementia without behavioral disturbance: Secondary | ICD-10-CM | POA: Diagnosis not present

## 2015-06-03 DIAGNOSIS — M810 Age-related osteoporosis without current pathological fracture: Secondary | ICD-10-CM | POA: Diagnosis not present

## 2015-06-03 DIAGNOSIS — M6281 Muscle weakness (generalized): Secondary | ICD-10-CM | POA: Diagnosis not present

## 2015-06-03 DIAGNOSIS — I482 Chronic atrial fibrillation: Secondary | ICD-10-CM | POA: Diagnosis not present

## 2015-06-03 DIAGNOSIS — R2689 Other abnormalities of gait and mobility: Secondary | ICD-10-CM | POA: Diagnosis not present

## 2015-06-03 DIAGNOSIS — R569 Unspecified convulsions: Secondary | ICD-10-CM | POA: Diagnosis not present

## 2015-06-05 DIAGNOSIS — F0390 Unspecified dementia without behavioral disturbance: Secondary | ICD-10-CM | POA: Diagnosis not present

## 2015-06-05 DIAGNOSIS — M6281 Muscle weakness (generalized): Secondary | ICD-10-CM | POA: Diagnosis not present

## 2015-06-05 DIAGNOSIS — M810 Age-related osteoporosis without current pathological fracture: Secondary | ICD-10-CM | POA: Diagnosis not present

## 2015-06-05 DIAGNOSIS — R2689 Other abnormalities of gait and mobility: Secondary | ICD-10-CM | POA: Diagnosis not present

## 2015-06-05 DIAGNOSIS — R569 Unspecified convulsions: Secondary | ICD-10-CM | POA: Diagnosis not present

## 2015-06-05 DIAGNOSIS — I482 Chronic atrial fibrillation: Secondary | ICD-10-CM | POA: Diagnosis not present

## 2015-06-10 DIAGNOSIS — I482 Chronic atrial fibrillation: Secondary | ICD-10-CM | POA: Diagnosis not present

## 2015-06-10 DIAGNOSIS — M810 Age-related osteoporosis without current pathological fracture: Secondary | ICD-10-CM | POA: Diagnosis not present

## 2015-06-10 DIAGNOSIS — F0390 Unspecified dementia without behavioral disturbance: Secondary | ICD-10-CM | POA: Diagnosis not present

## 2015-06-10 DIAGNOSIS — R2689 Other abnormalities of gait and mobility: Secondary | ICD-10-CM | POA: Diagnosis not present

## 2015-06-10 DIAGNOSIS — M6281 Muscle weakness (generalized): Secondary | ICD-10-CM | POA: Diagnosis not present

## 2015-06-10 DIAGNOSIS — R569 Unspecified convulsions: Secondary | ICD-10-CM | POA: Diagnosis not present

## 2015-06-10 DIAGNOSIS — E039 Hypothyroidism, unspecified: Secondary | ICD-10-CM | POA: Diagnosis not present

## 2015-06-10 DIAGNOSIS — E782 Mixed hyperlipidemia: Secondary | ICD-10-CM | POA: Diagnosis not present

## 2015-06-12 DIAGNOSIS — M6281 Muscle weakness (generalized): Secondary | ICD-10-CM | POA: Diagnosis not present

## 2015-06-12 DIAGNOSIS — R569 Unspecified convulsions: Secondary | ICD-10-CM | POA: Diagnosis not present

## 2015-06-12 DIAGNOSIS — M810 Age-related osteoporosis without current pathological fracture: Secondary | ICD-10-CM | POA: Diagnosis not present

## 2015-06-12 DIAGNOSIS — R2689 Other abnormalities of gait and mobility: Secondary | ICD-10-CM | POA: Diagnosis not present

## 2015-06-12 DIAGNOSIS — F0390 Unspecified dementia without behavioral disturbance: Secondary | ICD-10-CM | POA: Diagnosis not present

## 2015-06-12 DIAGNOSIS — I482 Chronic atrial fibrillation: Secondary | ICD-10-CM | POA: Diagnosis not present

## 2015-06-18 DIAGNOSIS — M810 Age-related osteoporosis without current pathological fracture: Secondary | ICD-10-CM | POA: Diagnosis not present

## 2015-06-18 DIAGNOSIS — I482 Chronic atrial fibrillation: Secondary | ICD-10-CM | POA: Diagnosis not present

## 2015-06-18 DIAGNOSIS — D696 Thrombocytopenia, unspecified: Secondary | ICD-10-CM | POA: Diagnosis not present

## 2015-06-18 DIAGNOSIS — R569 Unspecified convulsions: Secondary | ICD-10-CM | POA: Diagnosis not present

## 2015-06-18 DIAGNOSIS — R2689 Other abnormalities of gait and mobility: Secondary | ICD-10-CM | POA: Diagnosis not present

## 2015-06-18 DIAGNOSIS — F0390 Unspecified dementia without behavioral disturbance: Secondary | ICD-10-CM | POA: Diagnosis not present

## 2015-06-18 DIAGNOSIS — E782 Mixed hyperlipidemia: Secondary | ICD-10-CM | POA: Diagnosis not present

## 2015-06-18 DIAGNOSIS — E039 Hypothyroidism, unspecified: Secondary | ICD-10-CM | POA: Diagnosis not present

## 2015-06-18 DIAGNOSIS — M6281 Muscle weakness (generalized): Secondary | ICD-10-CM | POA: Diagnosis not present

## 2015-06-20 DIAGNOSIS — M810 Age-related osteoporosis without current pathological fracture: Secondary | ICD-10-CM | POA: Diagnosis not present

## 2015-06-20 DIAGNOSIS — R2689 Other abnormalities of gait and mobility: Secondary | ICD-10-CM | POA: Diagnosis not present

## 2015-06-20 DIAGNOSIS — M6281 Muscle weakness (generalized): Secondary | ICD-10-CM | POA: Diagnosis not present

## 2015-06-20 DIAGNOSIS — R569 Unspecified convulsions: Secondary | ICD-10-CM | POA: Diagnosis not present

## 2015-06-20 DIAGNOSIS — I482 Chronic atrial fibrillation: Secondary | ICD-10-CM | POA: Diagnosis not present

## 2015-06-20 DIAGNOSIS — F0390 Unspecified dementia without behavioral disturbance: Secondary | ICD-10-CM | POA: Diagnosis not present

## 2015-06-26 DIAGNOSIS — R569 Unspecified convulsions: Secondary | ICD-10-CM | POA: Diagnosis not present

## 2015-06-26 DIAGNOSIS — I482 Chronic atrial fibrillation: Secondary | ICD-10-CM | POA: Diagnosis not present

## 2015-06-26 DIAGNOSIS — M810 Age-related osteoporosis without current pathological fracture: Secondary | ICD-10-CM | POA: Diagnosis not present

## 2015-06-26 DIAGNOSIS — R2689 Other abnormalities of gait and mobility: Secondary | ICD-10-CM | POA: Diagnosis not present

## 2015-06-26 DIAGNOSIS — M6281 Muscle weakness (generalized): Secondary | ICD-10-CM | POA: Diagnosis not present

## 2015-06-26 DIAGNOSIS — F0390 Unspecified dementia without behavioral disturbance: Secondary | ICD-10-CM | POA: Diagnosis not present

## 2015-06-28 DIAGNOSIS — M810 Age-related osteoporosis without current pathological fracture: Secondary | ICD-10-CM | POA: Diagnosis not present

## 2015-06-28 DIAGNOSIS — M6281 Muscle weakness (generalized): Secondary | ICD-10-CM | POA: Diagnosis not present

## 2015-06-28 DIAGNOSIS — R2689 Other abnormalities of gait and mobility: Secondary | ICD-10-CM | POA: Diagnosis not present

## 2015-06-28 DIAGNOSIS — I482 Chronic atrial fibrillation: Secondary | ICD-10-CM | POA: Diagnosis not present

## 2015-06-28 DIAGNOSIS — F0390 Unspecified dementia without behavioral disturbance: Secondary | ICD-10-CM | POA: Diagnosis not present

## 2015-06-28 DIAGNOSIS — R569 Unspecified convulsions: Secondary | ICD-10-CM | POA: Diagnosis not present

## 2015-07-01 DIAGNOSIS — R2689 Other abnormalities of gait and mobility: Secondary | ICD-10-CM | POA: Diagnosis not present

## 2015-07-01 DIAGNOSIS — M6281 Muscle weakness (generalized): Secondary | ICD-10-CM | POA: Diagnosis not present

## 2015-07-01 DIAGNOSIS — Z9181 History of falling: Secondary | ICD-10-CM | POA: Diagnosis not present

## 2015-07-01 DIAGNOSIS — R569 Unspecified convulsions: Secondary | ICD-10-CM | POA: Diagnosis not present

## 2015-07-01 DIAGNOSIS — I482 Chronic atrial fibrillation: Secondary | ICD-10-CM | POA: Diagnosis not present

## 2015-07-01 DIAGNOSIS — M810 Age-related osteoporosis without current pathological fracture: Secondary | ICD-10-CM | POA: Diagnosis not present

## 2015-07-01 DIAGNOSIS — F0390 Unspecified dementia without behavioral disturbance: Secondary | ICD-10-CM | POA: Diagnosis not present

## 2015-07-02 DIAGNOSIS — M6281 Muscle weakness (generalized): Secondary | ICD-10-CM | POA: Diagnosis not present

## 2015-07-02 DIAGNOSIS — I482 Chronic atrial fibrillation: Secondary | ICD-10-CM | POA: Diagnosis not present

## 2015-07-02 DIAGNOSIS — R2689 Other abnormalities of gait and mobility: Secondary | ICD-10-CM | POA: Diagnosis not present

## 2015-07-02 DIAGNOSIS — M810 Age-related osteoporosis without current pathological fracture: Secondary | ICD-10-CM | POA: Diagnosis not present

## 2015-07-02 DIAGNOSIS — R569 Unspecified convulsions: Secondary | ICD-10-CM | POA: Diagnosis not present

## 2015-07-02 DIAGNOSIS — F0390 Unspecified dementia without behavioral disturbance: Secondary | ICD-10-CM | POA: Diagnosis not present

## 2015-07-03 DIAGNOSIS — R2689 Other abnormalities of gait and mobility: Secondary | ICD-10-CM | POA: Diagnosis not present

## 2015-07-03 DIAGNOSIS — M6281 Muscle weakness (generalized): Secondary | ICD-10-CM | POA: Diagnosis not present

## 2015-07-03 DIAGNOSIS — I482 Chronic atrial fibrillation: Secondary | ICD-10-CM | POA: Diagnosis not present

## 2015-07-03 DIAGNOSIS — R569 Unspecified convulsions: Secondary | ICD-10-CM | POA: Diagnosis not present

## 2015-07-03 DIAGNOSIS — F0390 Unspecified dementia without behavioral disturbance: Secondary | ICD-10-CM | POA: Diagnosis not present

## 2015-07-03 DIAGNOSIS — M810 Age-related osteoporosis without current pathological fracture: Secondary | ICD-10-CM | POA: Diagnosis not present

## 2015-07-08 DIAGNOSIS — M810 Age-related osteoporosis without current pathological fracture: Secondary | ICD-10-CM | POA: Diagnosis not present

## 2015-07-08 DIAGNOSIS — F0390 Unspecified dementia without behavioral disturbance: Secondary | ICD-10-CM | POA: Diagnosis not present

## 2015-07-08 DIAGNOSIS — R2689 Other abnormalities of gait and mobility: Secondary | ICD-10-CM | POA: Diagnosis not present

## 2015-07-08 DIAGNOSIS — I482 Chronic atrial fibrillation: Secondary | ICD-10-CM | POA: Diagnosis not present

## 2015-07-08 DIAGNOSIS — R569 Unspecified convulsions: Secondary | ICD-10-CM | POA: Diagnosis not present

## 2015-07-08 DIAGNOSIS — M6281 Muscle weakness (generalized): Secondary | ICD-10-CM | POA: Diagnosis not present

## 2015-07-10 DIAGNOSIS — M810 Age-related osteoporosis without current pathological fracture: Secondary | ICD-10-CM | POA: Diagnosis not present

## 2015-07-10 DIAGNOSIS — F0390 Unspecified dementia without behavioral disturbance: Secondary | ICD-10-CM | POA: Diagnosis not present

## 2015-07-10 DIAGNOSIS — R2689 Other abnormalities of gait and mobility: Secondary | ICD-10-CM | POA: Diagnosis not present

## 2015-07-10 DIAGNOSIS — M6281 Muscle weakness (generalized): Secondary | ICD-10-CM | POA: Diagnosis not present

## 2015-07-10 DIAGNOSIS — R569 Unspecified convulsions: Secondary | ICD-10-CM | POA: Diagnosis not present

## 2015-07-10 DIAGNOSIS — I482 Chronic atrial fibrillation: Secondary | ICD-10-CM | POA: Diagnosis not present

## 2015-07-15 DIAGNOSIS — R569 Unspecified convulsions: Secondary | ICD-10-CM | POA: Diagnosis not present

## 2015-07-15 DIAGNOSIS — M810 Age-related osteoporosis without current pathological fracture: Secondary | ICD-10-CM | POA: Diagnosis not present

## 2015-07-15 DIAGNOSIS — F0390 Unspecified dementia without behavioral disturbance: Secondary | ICD-10-CM | POA: Diagnosis not present

## 2015-07-15 DIAGNOSIS — R2689 Other abnormalities of gait and mobility: Secondary | ICD-10-CM | POA: Diagnosis not present

## 2015-07-15 DIAGNOSIS — I482 Chronic atrial fibrillation: Secondary | ICD-10-CM | POA: Diagnosis not present

## 2015-07-15 DIAGNOSIS — M6281 Muscle weakness (generalized): Secondary | ICD-10-CM | POA: Diagnosis not present

## 2015-07-18 DIAGNOSIS — R2689 Other abnormalities of gait and mobility: Secondary | ICD-10-CM | POA: Diagnosis not present

## 2015-07-18 DIAGNOSIS — I482 Chronic atrial fibrillation: Secondary | ICD-10-CM | POA: Diagnosis not present

## 2015-07-18 DIAGNOSIS — M6281 Muscle weakness (generalized): Secondary | ICD-10-CM | POA: Diagnosis not present

## 2015-07-18 DIAGNOSIS — F0390 Unspecified dementia without behavioral disturbance: Secondary | ICD-10-CM | POA: Diagnosis not present

## 2015-07-18 DIAGNOSIS — M810 Age-related osteoporosis without current pathological fracture: Secondary | ICD-10-CM | POA: Diagnosis not present

## 2015-07-18 DIAGNOSIS — R569 Unspecified convulsions: Secondary | ICD-10-CM | POA: Diagnosis not present

## 2015-07-19 DIAGNOSIS — R569 Unspecified convulsions: Secondary | ICD-10-CM | POA: Diagnosis not present

## 2015-07-19 DIAGNOSIS — F0390 Unspecified dementia without behavioral disturbance: Secondary | ICD-10-CM | POA: Diagnosis not present

## 2015-07-19 DIAGNOSIS — M810 Age-related osteoporosis without current pathological fracture: Secondary | ICD-10-CM | POA: Diagnosis not present

## 2015-07-19 DIAGNOSIS — M6281 Muscle weakness (generalized): Secondary | ICD-10-CM | POA: Diagnosis not present

## 2015-07-19 DIAGNOSIS — I482 Chronic atrial fibrillation: Secondary | ICD-10-CM | POA: Diagnosis not present

## 2015-07-19 DIAGNOSIS — R2689 Other abnormalities of gait and mobility: Secondary | ICD-10-CM | POA: Diagnosis not present

## 2015-07-20 DIAGNOSIS — H6122 Impacted cerumen, left ear: Secondary | ICD-10-CM | POA: Diagnosis not present

## 2015-07-22 DIAGNOSIS — F0390 Unspecified dementia without behavioral disturbance: Secondary | ICD-10-CM | POA: Diagnosis not present

## 2015-07-22 DIAGNOSIS — M810 Age-related osteoporosis without current pathological fracture: Secondary | ICD-10-CM | POA: Diagnosis not present

## 2015-07-22 DIAGNOSIS — I482 Chronic atrial fibrillation: Secondary | ICD-10-CM | POA: Diagnosis not present

## 2015-07-22 DIAGNOSIS — R569 Unspecified convulsions: Secondary | ICD-10-CM | POA: Diagnosis not present

## 2015-07-22 DIAGNOSIS — M6281 Muscle weakness (generalized): Secondary | ICD-10-CM | POA: Diagnosis not present

## 2015-07-22 DIAGNOSIS — R2689 Other abnormalities of gait and mobility: Secondary | ICD-10-CM | POA: Diagnosis not present

## 2015-07-25 DIAGNOSIS — I482 Chronic atrial fibrillation: Secondary | ICD-10-CM | POA: Diagnosis not present

## 2015-07-25 DIAGNOSIS — M6281 Muscle weakness (generalized): Secondary | ICD-10-CM | POA: Diagnosis not present

## 2015-07-25 DIAGNOSIS — M810 Age-related osteoporosis without current pathological fracture: Secondary | ICD-10-CM | POA: Diagnosis not present

## 2015-07-25 DIAGNOSIS — R2689 Other abnormalities of gait and mobility: Secondary | ICD-10-CM | POA: Diagnosis not present

## 2015-07-25 DIAGNOSIS — R569 Unspecified convulsions: Secondary | ICD-10-CM | POA: Diagnosis not present

## 2015-07-25 DIAGNOSIS — F0390 Unspecified dementia without behavioral disturbance: Secondary | ICD-10-CM | POA: Diagnosis not present

## 2015-07-28 DIAGNOSIS — I482 Chronic atrial fibrillation: Secondary | ICD-10-CM | POA: Diagnosis not present

## 2015-07-28 DIAGNOSIS — M6281 Muscle weakness (generalized): Secondary | ICD-10-CM | POA: Diagnosis not present

## 2015-07-28 DIAGNOSIS — R2689 Other abnormalities of gait and mobility: Secondary | ICD-10-CM | POA: Diagnosis not present

## 2015-07-28 DIAGNOSIS — M810 Age-related osteoporosis without current pathological fracture: Secondary | ICD-10-CM | POA: Diagnosis not present

## 2015-07-28 DIAGNOSIS — F0390 Unspecified dementia without behavioral disturbance: Secondary | ICD-10-CM | POA: Diagnosis not present

## 2015-07-28 DIAGNOSIS — R569 Unspecified convulsions: Secondary | ICD-10-CM | POA: Diagnosis not present

## 2015-07-30 DIAGNOSIS — H6123 Impacted cerumen, bilateral: Secondary | ICD-10-CM | POA: Diagnosis not present

## 2015-08-01 DIAGNOSIS — R2689 Other abnormalities of gait and mobility: Secondary | ICD-10-CM | POA: Diagnosis not present

## 2015-08-01 DIAGNOSIS — F0390 Unspecified dementia without behavioral disturbance: Secondary | ICD-10-CM | POA: Diagnosis not present

## 2015-08-01 DIAGNOSIS — I482 Chronic atrial fibrillation: Secondary | ICD-10-CM | POA: Diagnosis not present

## 2015-08-01 DIAGNOSIS — R569 Unspecified convulsions: Secondary | ICD-10-CM | POA: Diagnosis not present

## 2015-08-01 DIAGNOSIS — M810 Age-related osteoporosis without current pathological fracture: Secondary | ICD-10-CM | POA: Diagnosis not present

## 2015-08-01 DIAGNOSIS — M6281 Muscle weakness (generalized): Secondary | ICD-10-CM | POA: Diagnosis not present

## 2015-08-04 DIAGNOSIS — R569 Unspecified convulsions: Secondary | ICD-10-CM | POA: Diagnosis not present

## 2015-08-04 DIAGNOSIS — M810 Age-related osteoporosis without current pathological fracture: Secondary | ICD-10-CM | POA: Diagnosis not present

## 2015-08-04 DIAGNOSIS — R2689 Other abnormalities of gait and mobility: Secondary | ICD-10-CM | POA: Diagnosis not present

## 2015-08-04 DIAGNOSIS — F0390 Unspecified dementia without behavioral disturbance: Secondary | ICD-10-CM | POA: Diagnosis not present

## 2015-08-04 DIAGNOSIS — I482 Chronic atrial fibrillation: Secondary | ICD-10-CM | POA: Diagnosis not present

## 2015-08-04 DIAGNOSIS — M6281 Muscle weakness (generalized): Secondary | ICD-10-CM | POA: Diagnosis not present

## 2015-08-08 DIAGNOSIS — R2689 Other abnormalities of gait and mobility: Secondary | ICD-10-CM | POA: Diagnosis not present

## 2015-08-08 DIAGNOSIS — M810 Age-related osteoporosis without current pathological fracture: Secondary | ICD-10-CM | POA: Diagnosis not present

## 2015-08-08 DIAGNOSIS — F0390 Unspecified dementia without behavioral disturbance: Secondary | ICD-10-CM | POA: Diagnosis not present

## 2015-08-08 DIAGNOSIS — R569 Unspecified convulsions: Secondary | ICD-10-CM | POA: Diagnosis not present

## 2015-08-08 DIAGNOSIS — I482 Chronic atrial fibrillation: Secondary | ICD-10-CM | POA: Diagnosis not present

## 2015-08-08 DIAGNOSIS — M6281 Muscle weakness (generalized): Secondary | ICD-10-CM | POA: Diagnosis not present

## 2015-08-11 DIAGNOSIS — R569 Unspecified convulsions: Secondary | ICD-10-CM | POA: Diagnosis not present

## 2015-08-11 DIAGNOSIS — M6281 Muscle weakness (generalized): Secondary | ICD-10-CM | POA: Diagnosis not present

## 2015-08-11 DIAGNOSIS — M810 Age-related osteoporosis without current pathological fracture: Secondary | ICD-10-CM | POA: Diagnosis not present

## 2015-08-11 DIAGNOSIS — I482 Chronic atrial fibrillation: Secondary | ICD-10-CM | POA: Diagnosis not present

## 2015-08-11 DIAGNOSIS — R2689 Other abnormalities of gait and mobility: Secondary | ICD-10-CM | POA: Diagnosis not present

## 2015-08-11 DIAGNOSIS — F0390 Unspecified dementia without behavioral disturbance: Secondary | ICD-10-CM | POA: Diagnosis not present

## 2015-08-15 DIAGNOSIS — F0390 Unspecified dementia without behavioral disturbance: Secondary | ICD-10-CM | POA: Diagnosis not present

## 2015-08-15 DIAGNOSIS — M6281 Muscle weakness (generalized): Secondary | ICD-10-CM | POA: Diagnosis not present

## 2015-08-15 DIAGNOSIS — M810 Age-related osteoporosis without current pathological fracture: Secondary | ICD-10-CM | POA: Diagnosis not present

## 2015-08-15 DIAGNOSIS — R2689 Other abnormalities of gait and mobility: Secondary | ICD-10-CM | POA: Diagnosis not present

## 2015-08-15 DIAGNOSIS — R569 Unspecified convulsions: Secondary | ICD-10-CM | POA: Diagnosis not present

## 2015-08-15 DIAGNOSIS — I482 Chronic atrial fibrillation: Secondary | ICD-10-CM | POA: Diagnosis not present

## 2015-08-20 DIAGNOSIS — F0390 Unspecified dementia without behavioral disturbance: Secondary | ICD-10-CM | POA: Diagnosis not present

## 2015-08-20 DIAGNOSIS — R2689 Other abnormalities of gait and mobility: Secondary | ICD-10-CM | POA: Diagnosis not present

## 2015-08-20 DIAGNOSIS — R569 Unspecified convulsions: Secondary | ICD-10-CM | POA: Diagnosis not present

## 2015-08-20 DIAGNOSIS — I482 Chronic atrial fibrillation: Secondary | ICD-10-CM | POA: Diagnosis not present

## 2015-08-20 DIAGNOSIS — M6281 Muscle weakness (generalized): Secondary | ICD-10-CM | POA: Diagnosis not present

## 2015-08-20 DIAGNOSIS — M810 Age-related osteoporosis without current pathological fracture: Secondary | ICD-10-CM | POA: Diagnosis not present

## 2015-08-21 DIAGNOSIS — R05 Cough: Secondary | ICD-10-CM | POA: Diagnosis not present

## 2015-08-21 DIAGNOSIS — J00 Acute nasopharyngitis [common cold]: Secondary | ICD-10-CM | POA: Diagnosis not present

## 2015-08-22 DIAGNOSIS — M6281 Muscle weakness (generalized): Secondary | ICD-10-CM | POA: Diagnosis not present

## 2015-08-22 DIAGNOSIS — M810 Age-related osteoporosis without current pathological fracture: Secondary | ICD-10-CM | POA: Diagnosis not present

## 2015-08-22 DIAGNOSIS — R569 Unspecified convulsions: Secondary | ICD-10-CM | POA: Diagnosis not present

## 2015-08-22 DIAGNOSIS — I482 Chronic atrial fibrillation: Secondary | ICD-10-CM | POA: Diagnosis not present

## 2015-08-22 DIAGNOSIS — F0390 Unspecified dementia without behavioral disturbance: Secondary | ICD-10-CM | POA: Diagnosis not present

## 2015-08-22 DIAGNOSIS — R2689 Other abnormalities of gait and mobility: Secondary | ICD-10-CM | POA: Diagnosis not present

## 2015-08-27 DIAGNOSIS — M810 Age-related osteoporosis without current pathological fracture: Secondary | ICD-10-CM | POA: Diagnosis not present

## 2015-08-27 DIAGNOSIS — R2689 Other abnormalities of gait and mobility: Secondary | ICD-10-CM | POA: Diagnosis not present

## 2015-08-27 DIAGNOSIS — F0390 Unspecified dementia without behavioral disturbance: Secondary | ICD-10-CM | POA: Diagnosis not present

## 2015-08-27 DIAGNOSIS — M6281 Muscle weakness (generalized): Secondary | ICD-10-CM | POA: Diagnosis not present

## 2015-08-27 DIAGNOSIS — R569 Unspecified convulsions: Secondary | ICD-10-CM | POA: Diagnosis not present

## 2015-08-27 DIAGNOSIS — I482 Chronic atrial fibrillation: Secondary | ICD-10-CM | POA: Diagnosis not present

## 2015-08-29 DIAGNOSIS — I482 Chronic atrial fibrillation: Secondary | ICD-10-CM | POA: Diagnosis not present

## 2015-08-29 DIAGNOSIS — M810 Age-related osteoporosis without current pathological fracture: Secondary | ICD-10-CM | POA: Diagnosis not present

## 2015-08-29 DIAGNOSIS — M6281 Muscle weakness (generalized): Secondary | ICD-10-CM | POA: Diagnosis not present

## 2015-08-29 DIAGNOSIS — R569 Unspecified convulsions: Secondary | ICD-10-CM | POA: Diagnosis not present

## 2015-08-29 DIAGNOSIS — F0390 Unspecified dementia without behavioral disturbance: Secondary | ICD-10-CM | POA: Diagnosis not present

## 2015-08-29 DIAGNOSIS — R2689 Other abnormalities of gait and mobility: Secondary | ICD-10-CM | POA: Diagnosis not present

## 2015-10-01 DIAGNOSIS — R296 Repeated falls: Secondary | ICD-10-CM | POA: Diagnosis not present

## 2015-10-01 DIAGNOSIS — F0390 Unspecified dementia without behavioral disturbance: Secondary | ICD-10-CM | POA: Diagnosis not present

## 2015-10-04 DIAGNOSIS — S8011XD Contusion of right lower leg, subsequent encounter: Secondary | ICD-10-CM | POA: Diagnosis not present

## 2015-10-04 DIAGNOSIS — F0390 Unspecified dementia without behavioral disturbance: Secondary | ICD-10-CM | POA: Diagnosis not present

## 2015-10-04 DIAGNOSIS — Z9181 History of falling: Secondary | ICD-10-CM | POA: Diagnosis not present

## 2015-10-04 DIAGNOSIS — M6281 Muscle weakness (generalized): Secondary | ICD-10-CM | POA: Diagnosis not present

## 2015-10-04 DIAGNOSIS — R569 Unspecified convulsions: Secondary | ICD-10-CM | POA: Diagnosis not present

## 2015-10-04 DIAGNOSIS — M810 Age-related osteoporosis without current pathological fracture: Secondary | ICD-10-CM | POA: Diagnosis not present

## 2015-10-04 DIAGNOSIS — R2689 Other abnormalities of gait and mobility: Secondary | ICD-10-CM | POA: Diagnosis not present

## 2015-10-04 DIAGNOSIS — I4891 Unspecified atrial fibrillation: Secondary | ICD-10-CM | POA: Diagnosis not present

## 2015-10-07 DIAGNOSIS — S8011XD Contusion of right lower leg, subsequent encounter: Secondary | ICD-10-CM | POA: Diagnosis not present

## 2015-10-07 DIAGNOSIS — Z9181 History of falling: Secondary | ICD-10-CM | POA: Diagnosis not present

## 2015-10-07 DIAGNOSIS — M6281 Muscle weakness (generalized): Secondary | ICD-10-CM | POA: Diagnosis not present

## 2015-10-07 DIAGNOSIS — F0390 Unspecified dementia without behavioral disturbance: Secondary | ICD-10-CM | POA: Diagnosis not present

## 2015-10-07 DIAGNOSIS — R2689 Other abnormalities of gait and mobility: Secondary | ICD-10-CM | POA: Diagnosis not present

## 2015-10-07 DIAGNOSIS — I4891 Unspecified atrial fibrillation: Secondary | ICD-10-CM | POA: Diagnosis not present

## 2015-10-09 DIAGNOSIS — R2689 Other abnormalities of gait and mobility: Secondary | ICD-10-CM | POA: Diagnosis not present

## 2015-10-09 DIAGNOSIS — I4891 Unspecified atrial fibrillation: Secondary | ICD-10-CM | POA: Diagnosis not present

## 2015-10-09 DIAGNOSIS — Z9181 History of falling: Secondary | ICD-10-CM | POA: Diagnosis not present

## 2015-10-09 DIAGNOSIS — S8011XD Contusion of right lower leg, subsequent encounter: Secondary | ICD-10-CM | POA: Diagnosis not present

## 2015-10-09 DIAGNOSIS — M6281 Muscle weakness (generalized): Secondary | ICD-10-CM | POA: Diagnosis not present

## 2015-10-09 DIAGNOSIS — F0390 Unspecified dementia without behavioral disturbance: Secondary | ICD-10-CM | POA: Diagnosis not present

## 2015-10-14 DIAGNOSIS — Z9181 History of falling: Secondary | ICD-10-CM | POA: Diagnosis not present

## 2015-10-14 DIAGNOSIS — F0390 Unspecified dementia without behavioral disturbance: Secondary | ICD-10-CM | POA: Diagnosis not present

## 2015-10-14 DIAGNOSIS — R2689 Other abnormalities of gait and mobility: Secondary | ICD-10-CM | POA: Diagnosis not present

## 2015-10-14 DIAGNOSIS — M6281 Muscle weakness (generalized): Secondary | ICD-10-CM | POA: Diagnosis not present

## 2015-10-14 DIAGNOSIS — S8011XD Contusion of right lower leg, subsequent encounter: Secondary | ICD-10-CM | POA: Diagnosis not present

## 2015-10-14 DIAGNOSIS — I4891 Unspecified atrial fibrillation: Secondary | ICD-10-CM | POA: Diagnosis not present

## 2015-10-16 DIAGNOSIS — F0390 Unspecified dementia without behavioral disturbance: Secondary | ICD-10-CM | POA: Diagnosis not present

## 2015-10-16 DIAGNOSIS — S8011XD Contusion of right lower leg, subsequent encounter: Secondary | ICD-10-CM | POA: Diagnosis not present

## 2015-10-16 DIAGNOSIS — Z9181 History of falling: Secondary | ICD-10-CM | POA: Diagnosis not present

## 2015-10-16 DIAGNOSIS — M6281 Muscle weakness (generalized): Secondary | ICD-10-CM | POA: Diagnosis not present

## 2015-10-16 DIAGNOSIS — R2689 Other abnormalities of gait and mobility: Secondary | ICD-10-CM | POA: Diagnosis not present

## 2015-10-16 DIAGNOSIS — I4891 Unspecified atrial fibrillation: Secondary | ICD-10-CM | POA: Diagnosis not present

## 2015-10-21 DIAGNOSIS — F0390 Unspecified dementia without behavioral disturbance: Secondary | ICD-10-CM | POA: Diagnosis not present

## 2015-10-21 DIAGNOSIS — R2689 Other abnormalities of gait and mobility: Secondary | ICD-10-CM | POA: Diagnosis not present

## 2015-10-21 DIAGNOSIS — M6281 Muscle weakness (generalized): Secondary | ICD-10-CM | POA: Diagnosis not present

## 2015-10-21 DIAGNOSIS — Z9181 History of falling: Secondary | ICD-10-CM | POA: Diagnosis not present

## 2015-10-21 DIAGNOSIS — S8011XD Contusion of right lower leg, subsequent encounter: Secondary | ICD-10-CM | POA: Diagnosis not present

## 2015-10-21 DIAGNOSIS — I4891 Unspecified atrial fibrillation: Secondary | ICD-10-CM | POA: Diagnosis not present

## 2015-10-24 DIAGNOSIS — Z9181 History of falling: Secondary | ICD-10-CM | POA: Diagnosis not present

## 2015-10-24 DIAGNOSIS — S8011XD Contusion of right lower leg, subsequent encounter: Secondary | ICD-10-CM | POA: Diagnosis not present

## 2015-10-24 DIAGNOSIS — I4891 Unspecified atrial fibrillation: Secondary | ICD-10-CM | POA: Diagnosis not present

## 2015-10-24 DIAGNOSIS — R2689 Other abnormalities of gait and mobility: Secondary | ICD-10-CM | POA: Diagnosis not present

## 2015-10-24 DIAGNOSIS — F0390 Unspecified dementia without behavioral disturbance: Secondary | ICD-10-CM | POA: Diagnosis not present

## 2015-10-24 DIAGNOSIS — M6281 Muscle weakness (generalized): Secondary | ICD-10-CM | POA: Diagnosis not present

## 2015-10-28 DIAGNOSIS — S8011XD Contusion of right lower leg, subsequent encounter: Secondary | ICD-10-CM | POA: Diagnosis not present

## 2015-10-28 DIAGNOSIS — R2689 Other abnormalities of gait and mobility: Secondary | ICD-10-CM | POA: Diagnosis not present

## 2015-10-28 DIAGNOSIS — I4891 Unspecified atrial fibrillation: Secondary | ICD-10-CM | POA: Diagnosis not present

## 2015-10-28 DIAGNOSIS — F0390 Unspecified dementia without behavioral disturbance: Secondary | ICD-10-CM | POA: Diagnosis not present

## 2015-10-28 DIAGNOSIS — Z9181 History of falling: Secondary | ICD-10-CM | POA: Diagnosis not present

## 2015-10-28 DIAGNOSIS — M6281 Muscle weakness (generalized): Secondary | ICD-10-CM | POA: Diagnosis not present

## 2015-10-30 DIAGNOSIS — M6281 Muscle weakness (generalized): Secondary | ICD-10-CM | POA: Diagnosis not present

## 2015-10-30 DIAGNOSIS — R2689 Other abnormalities of gait and mobility: Secondary | ICD-10-CM | POA: Diagnosis not present

## 2015-10-30 DIAGNOSIS — Z9181 History of falling: Secondary | ICD-10-CM | POA: Diagnosis not present

## 2015-10-30 DIAGNOSIS — I4891 Unspecified atrial fibrillation: Secondary | ICD-10-CM | POA: Diagnosis not present

## 2015-10-30 DIAGNOSIS — F0390 Unspecified dementia without behavioral disturbance: Secondary | ICD-10-CM | POA: Diagnosis not present

## 2015-10-30 DIAGNOSIS — S8011XD Contusion of right lower leg, subsequent encounter: Secondary | ICD-10-CM | POA: Diagnosis not present

## 2015-11-04 DIAGNOSIS — M6281 Muscle weakness (generalized): Secondary | ICD-10-CM | POA: Diagnosis not present

## 2015-11-04 DIAGNOSIS — S8011XD Contusion of right lower leg, subsequent encounter: Secondary | ICD-10-CM | POA: Diagnosis not present

## 2015-11-04 DIAGNOSIS — F0390 Unspecified dementia without behavioral disturbance: Secondary | ICD-10-CM | POA: Diagnosis not present

## 2015-11-04 DIAGNOSIS — R2689 Other abnormalities of gait and mobility: Secondary | ICD-10-CM | POA: Diagnosis not present

## 2015-11-04 DIAGNOSIS — Z9181 History of falling: Secondary | ICD-10-CM | POA: Diagnosis not present

## 2015-11-04 DIAGNOSIS — I4891 Unspecified atrial fibrillation: Secondary | ICD-10-CM | POA: Diagnosis not present

## 2015-11-06 DIAGNOSIS — S8011XD Contusion of right lower leg, subsequent encounter: Secondary | ICD-10-CM | POA: Diagnosis not present

## 2015-11-06 DIAGNOSIS — M6281 Muscle weakness (generalized): Secondary | ICD-10-CM | POA: Diagnosis not present

## 2015-11-06 DIAGNOSIS — Z9181 History of falling: Secondary | ICD-10-CM | POA: Diagnosis not present

## 2015-11-06 DIAGNOSIS — R2689 Other abnormalities of gait and mobility: Secondary | ICD-10-CM | POA: Diagnosis not present

## 2015-11-06 DIAGNOSIS — I4891 Unspecified atrial fibrillation: Secondary | ICD-10-CM | POA: Diagnosis not present

## 2015-11-06 DIAGNOSIS — F0390 Unspecified dementia without behavioral disturbance: Secondary | ICD-10-CM | POA: Diagnosis not present

## 2015-11-11 DIAGNOSIS — F0390 Unspecified dementia without behavioral disturbance: Secondary | ICD-10-CM | POA: Diagnosis not present

## 2015-11-11 DIAGNOSIS — I4891 Unspecified atrial fibrillation: Secondary | ICD-10-CM | POA: Diagnosis not present

## 2015-11-11 DIAGNOSIS — M6281 Muscle weakness (generalized): Secondary | ICD-10-CM | POA: Diagnosis not present

## 2015-11-11 DIAGNOSIS — R2689 Other abnormalities of gait and mobility: Secondary | ICD-10-CM | POA: Diagnosis not present

## 2015-11-11 DIAGNOSIS — Z9181 History of falling: Secondary | ICD-10-CM | POA: Diagnosis not present

## 2015-11-11 DIAGNOSIS — S8011XD Contusion of right lower leg, subsequent encounter: Secondary | ICD-10-CM | POA: Diagnosis not present

## 2015-11-13 DIAGNOSIS — F0390 Unspecified dementia without behavioral disturbance: Secondary | ICD-10-CM | POA: Diagnosis not present

## 2015-11-13 DIAGNOSIS — Z9181 History of falling: Secondary | ICD-10-CM | POA: Diagnosis not present

## 2015-11-13 DIAGNOSIS — R2689 Other abnormalities of gait and mobility: Secondary | ICD-10-CM | POA: Diagnosis not present

## 2015-11-13 DIAGNOSIS — M6281 Muscle weakness (generalized): Secondary | ICD-10-CM | POA: Diagnosis not present

## 2015-11-13 DIAGNOSIS — S8011XD Contusion of right lower leg, subsequent encounter: Secondary | ICD-10-CM | POA: Diagnosis not present

## 2015-11-13 DIAGNOSIS — I4891 Unspecified atrial fibrillation: Secondary | ICD-10-CM | POA: Diagnosis not present

## 2015-11-19 DIAGNOSIS — Z9181 History of falling: Secondary | ICD-10-CM | POA: Diagnosis not present

## 2015-11-19 DIAGNOSIS — R2689 Other abnormalities of gait and mobility: Secondary | ICD-10-CM | POA: Diagnosis not present

## 2015-11-19 DIAGNOSIS — S8011XD Contusion of right lower leg, subsequent encounter: Secondary | ICD-10-CM | POA: Diagnosis not present

## 2015-11-19 DIAGNOSIS — F0390 Unspecified dementia without behavioral disturbance: Secondary | ICD-10-CM | POA: Diagnosis not present

## 2015-11-19 DIAGNOSIS — I4891 Unspecified atrial fibrillation: Secondary | ICD-10-CM | POA: Diagnosis not present

## 2015-11-19 DIAGNOSIS — M6281 Muscle weakness (generalized): Secondary | ICD-10-CM | POA: Diagnosis not present

## 2015-11-21 DIAGNOSIS — M6281 Muscle weakness (generalized): Secondary | ICD-10-CM | POA: Diagnosis not present

## 2015-11-21 DIAGNOSIS — R2689 Other abnormalities of gait and mobility: Secondary | ICD-10-CM | POA: Diagnosis not present

## 2015-11-21 DIAGNOSIS — I4891 Unspecified atrial fibrillation: Secondary | ICD-10-CM | POA: Diagnosis not present

## 2015-11-21 DIAGNOSIS — Z9181 History of falling: Secondary | ICD-10-CM | POA: Diagnosis not present

## 2015-11-21 DIAGNOSIS — S8011XD Contusion of right lower leg, subsequent encounter: Secondary | ICD-10-CM | POA: Diagnosis not present

## 2015-11-21 DIAGNOSIS — F0390 Unspecified dementia without behavioral disturbance: Secondary | ICD-10-CM | POA: Diagnosis not present

## 2015-11-25 DIAGNOSIS — R2689 Other abnormalities of gait and mobility: Secondary | ICD-10-CM | POA: Diagnosis not present

## 2015-11-25 DIAGNOSIS — M6281 Muscle weakness (generalized): Secondary | ICD-10-CM | POA: Diagnosis not present

## 2015-11-25 DIAGNOSIS — S8011XD Contusion of right lower leg, subsequent encounter: Secondary | ICD-10-CM | POA: Diagnosis not present

## 2015-11-25 DIAGNOSIS — Z9181 History of falling: Secondary | ICD-10-CM | POA: Diagnosis not present

## 2015-11-25 DIAGNOSIS — I4891 Unspecified atrial fibrillation: Secondary | ICD-10-CM | POA: Diagnosis not present

## 2015-11-25 DIAGNOSIS — F0390 Unspecified dementia without behavioral disturbance: Secondary | ICD-10-CM | POA: Diagnosis not present

## 2015-11-27 DIAGNOSIS — F0390 Unspecified dementia without behavioral disturbance: Secondary | ICD-10-CM | POA: Diagnosis not present

## 2015-11-27 DIAGNOSIS — Z9181 History of falling: Secondary | ICD-10-CM | POA: Diagnosis not present

## 2015-11-27 DIAGNOSIS — M6281 Muscle weakness (generalized): Secondary | ICD-10-CM | POA: Diagnosis not present

## 2015-11-27 DIAGNOSIS — I4891 Unspecified atrial fibrillation: Secondary | ICD-10-CM | POA: Diagnosis not present

## 2015-11-27 DIAGNOSIS — R2689 Other abnormalities of gait and mobility: Secondary | ICD-10-CM | POA: Diagnosis not present

## 2015-11-27 DIAGNOSIS — S8011XD Contusion of right lower leg, subsequent encounter: Secondary | ICD-10-CM | POA: Diagnosis not present

## 2015-12-09 DIAGNOSIS — M6281 Muscle weakness (generalized): Secondary | ICD-10-CM | POA: Diagnosis not present

## 2015-12-09 DIAGNOSIS — M818 Other osteoporosis without current pathological fracture: Secondary | ICD-10-CM | POA: Diagnosis not present

## 2015-12-09 DIAGNOSIS — R279 Unspecified lack of coordination: Secondary | ICD-10-CM | POA: Diagnosis not present

## 2015-12-09 DIAGNOSIS — I482 Chronic atrial fibrillation: Secondary | ICD-10-CM | POA: Diagnosis not present

## 2015-12-10 DIAGNOSIS — M6281 Muscle weakness (generalized): Secondary | ICD-10-CM | POA: Diagnosis not present

## 2015-12-10 DIAGNOSIS — M818 Other osteoporosis without current pathological fracture: Secondary | ICD-10-CM | POA: Diagnosis not present

## 2015-12-10 DIAGNOSIS — R279 Unspecified lack of coordination: Secondary | ICD-10-CM | POA: Diagnosis not present

## 2015-12-10 DIAGNOSIS — I482 Chronic atrial fibrillation: Secondary | ICD-10-CM | POA: Diagnosis not present

## 2015-12-11 DIAGNOSIS — I482 Chronic atrial fibrillation: Secondary | ICD-10-CM | POA: Diagnosis not present

## 2015-12-11 DIAGNOSIS — M6281 Muscle weakness (generalized): Secondary | ICD-10-CM | POA: Diagnosis not present

## 2015-12-11 DIAGNOSIS — R279 Unspecified lack of coordination: Secondary | ICD-10-CM | POA: Diagnosis not present

## 2015-12-11 DIAGNOSIS — M818 Other osteoporosis without current pathological fracture: Secondary | ICD-10-CM | POA: Diagnosis not present

## 2015-12-12 DIAGNOSIS — R279 Unspecified lack of coordination: Secondary | ICD-10-CM | POA: Diagnosis not present

## 2015-12-12 DIAGNOSIS — M6281 Muscle weakness (generalized): Secondary | ICD-10-CM | POA: Diagnosis not present

## 2015-12-12 DIAGNOSIS — I482 Chronic atrial fibrillation: Secondary | ICD-10-CM | POA: Diagnosis not present

## 2015-12-12 DIAGNOSIS — M818 Other osteoporosis without current pathological fracture: Secondary | ICD-10-CM | POA: Diagnosis not present

## 2015-12-16 DIAGNOSIS — M6281 Muscle weakness (generalized): Secondary | ICD-10-CM | POA: Diagnosis not present

## 2015-12-16 DIAGNOSIS — M818 Other osteoporosis without current pathological fracture: Secondary | ICD-10-CM | POA: Diagnosis not present

## 2015-12-16 DIAGNOSIS — R279 Unspecified lack of coordination: Secondary | ICD-10-CM | POA: Diagnosis not present

## 2015-12-16 DIAGNOSIS — I482 Chronic atrial fibrillation: Secondary | ICD-10-CM | POA: Diagnosis not present

## 2015-12-17 DIAGNOSIS — I482 Chronic atrial fibrillation: Secondary | ICD-10-CM | POA: Diagnosis not present

## 2015-12-17 DIAGNOSIS — R279 Unspecified lack of coordination: Secondary | ICD-10-CM | POA: Diagnosis not present

## 2015-12-17 DIAGNOSIS — M818 Other osteoporosis without current pathological fracture: Secondary | ICD-10-CM | POA: Diagnosis not present

## 2015-12-17 DIAGNOSIS — M6281 Muscle weakness (generalized): Secondary | ICD-10-CM | POA: Diagnosis not present

## 2015-12-18 DIAGNOSIS — M6281 Muscle weakness (generalized): Secondary | ICD-10-CM | POA: Diagnosis not present

## 2015-12-18 DIAGNOSIS — I482 Chronic atrial fibrillation: Secondary | ICD-10-CM | POA: Diagnosis not present

## 2015-12-18 DIAGNOSIS — E039 Hypothyroidism, unspecified: Secondary | ICD-10-CM | POA: Diagnosis not present

## 2015-12-18 DIAGNOSIS — M818 Other osteoporosis without current pathological fracture: Secondary | ICD-10-CM | POA: Diagnosis not present

## 2015-12-18 DIAGNOSIS — R279 Unspecified lack of coordination: Secondary | ICD-10-CM | POA: Diagnosis not present

## 2015-12-18 DIAGNOSIS — E782 Mixed hyperlipidemia: Secondary | ICD-10-CM | POA: Diagnosis not present

## 2015-12-19 DIAGNOSIS — M818 Other osteoporosis without current pathological fracture: Secondary | ICD-10-CM | POA: Diagnosis not present

## 2015-12-19 DIAGNOSIS — I482 Chronic atrial fibrillation: Secondary | ICD-10-CM | POA: Diagnosis not present

## 2015-12-19 DIAGNOSIS — R279 Unspecified lack of coordination: Secondary | ICD-10-CM | POA: Diagnosis not present

## 2015-12-19 DIAGNOSIS — M6281 Muscle weakness (generalized): Secondary | ICD-10-CM | POA: Diagnosis not present

## 2015-12-30 DIAGNOSIS — E039 Hypothyroidism, unspecified: Secondary | ICD-10-CM | POA: Diagnosis not present

## 2015-12-30 DIAGNOSIS — D509 Iron deficiency anemia, unspecified: Secondary | ICD-10-CM | POA: Diagnosis not present

## 2015-12-30 DIAGNOSIS — G8929 Other chronic pain: Secondary | ICD-10-CM | POA: Diagnosis not present

## 2015-12-30 DIAGNOSIS — E782 Mixed hyperlipidemia: Secondary | ICD-10-CM | POA: Diagnosis not present

## 2015-12-30 DIAGNOSIS — I482 Chronic atrial fibrillation: Secondary | ICD-10-CM | POA: Diagnosis not present

## 2015-12-30 DIAGNOSIS — F0390 Unspecified dementia without behavioral disturbance: Secondary | ICD-10-CM | POA: Diagnosis not present

## 2016-02-14 DIAGNOSIS — D696 Thrombocytopenia, unspecified: Secondary | ICD-10-CM | POA: Diagnosis not present

## 2016-02-14 DIAGNOSIS — E782 Mixed hyperlipidemia: Secondary | ICD-10-CM | POA: Diagnosis not present

## 2016-02-14 DIAGNOSIS — S8011XD Contusion of right lower leg, subsequent encounter: Secondary | ICD-10-CM | POA: Diagnosis not present

## 2016-02-14 DIAGNOSIS — F99 Mental disorder, not otherwise specified: Secondary | ICD-10-CM | POA: Diagnosis not present

## 2016-02-14 DIAGNOSIS — Z9181 History of falling: Secondary | ICD-10-CM | POA: Diagnosis not present

## 2016-02-14 DIAGNOSIS — G8929 Other chronic pain: Secondary | ICD-10-CM | POA: Diagnosis not present

## 2016-02-14 DIAGNOSIS — R2689 Other abnormalities of gait and mobility: Secondary | ICD-10-CM | POA: Diagnosis not present

## 2016-02-14 DIAGNOSIS — M25511 Pain in right shoulder: Secondary | ICD-10-CM | POA: Diagnosis not present

## 2016-02-14 DIAGNOSIS — K219 Gastro-esophageal reflux disease without esophagitis: Secondary | ICD-10-CM | POA: Diagnosis not present

## 2016-02-14 DIAGNOSIS — M6281 Muscle weakness (generalized): Secondary | ICD-10-CM | POA: Diagnosis not present

## 2016-02-14 DIAGNOSIS — M816 Localized osteoporosis [Lequesne]: Secondary | ICD-10-CM | POA: Diagnosis not present

## 2016-02-14 DIAGNOSIS — M810 Age-related osteoporosis without current pathological fracture: Secondary | ICD-10-CM | POA: Diagnosis not present

## 2016-02-14 DIAGNOSIS — I482 Chronic atrial fibrillation: Secondary | ICD-10-CM | POA: Diagnosis not present

## 2016-02-14 DIAGNOSIS — Z7982 Long term (current) use of aspirin: Secondary | ICD-10-CM | POA: Diagnosis not present

## 2016-02-14 DIAGNOSIS — R260 Ataxic gait: Secondary | ICD-10-CM | POA: Diagnosis not present

## 2016-02-14 DIAGNOSIS — E039 Hypothyroidism, unspecified: Secondary | ICD-10-CM | POA: Diagnosis not present

## 2016-02-14 DIAGNOSIS — F0390 Unspecified dementia without behavioral disturbance: Secondary | ICD-10-CM | POA: Diagnosis not present

## 2016-02-14 DIAGNOSIS — D509 Iron deficiency anemia, unspecified: Secondary | ICD-10-CM | POA: Diagnosis not present

## 2016-02-17 DIAGNOSIS — M810 Age-related osteoporosis without current pathological fracture: Secondary | ICD-10-CM | POA: Diagnosis not present

## 2016-02-17 DIAGNOSIS — G8929 Other chronic pain: Secondary | ICD-10-CM | POA: Diagnosis not present

## 2016-02-17 DIAGNOSIS — I482 Chronic atrial fibrillation: Secondary | ICD-10-CM | POA: Diagnosis not present

## 2016-02-17 DIAGNOSIS — F0390 Unspecified dementia without behavioral disturbance: Secondary | ICD-10-CM | POA: Diagnosis not present

## 2016-02-17 DIAGNOSIS — R260 Ataxic gait: Secondary | ICD-10-CM | POA: Diagnosis not present

## 2016-02-17 DIAGNOSIS — M25511 Pain in right shoulder: Secondary | ICD-10-CM | POA: Diagnosis not present

## 2016-02-19 DIAGNOSIS — R260 Ataxic gait: Secondary | ICD-10-CM | POA: Diagnosis not present

## 2016-02-19 DIAGNOSIS — M810 Age-related osteoporosis without current pathological fracture: Secondary | ICD-10-CM | POA: Diagnosis not present

## 2016-02-19 DIAGNOSIS — M25511 Pain in right shoulder: Secondary | ICD-10-CM | POA: Diagnosis not present

## 2016-02-19 DIAGNOSIS — F0390 Unspecified dementia without behavioral disturbance: Secondary | ICD-10-CM | POA: Diagnosis not present

## 2016-02-19 DIAGNOSIS — I482 Chronic atrial fibrillation: Secondary | ICD-10-CM | POA: Diagnosis not present

## 2016-02-19 DIAGNOSIS — G8929 Other chronic pain: Secondary | ICD-10-CM | POA: Diagnosis not present

## 2016-02-25 DIAGNOSIS — F0390 Unspecified dementia without behavioral disturbance: Secondary | ICD-10-CM | POA: Diagnosis not present

## 2016-02-25 DIAGNOSIS — M810 Age-related osteoporosis without current pathological fracture: Secondary | ICD-10-CM | POA: Diagnosis not present

## 2016-02-25 DIAGNOSIS — I482 Chronic atrial fibrillation: Secondary | ICD-10-CM | POA: Diagnosis not present

## 2016-02-25 DIAGNOSIS — G8929 Other chronic pain: Secondary | ICD-10-CM | POA: Diagnosis not present

## 2016-02-25 DIAGNOSIS — M25511 Pain in right shoulder: Secondary | ICD-10-CM | POA: Diagnosis not present

## 2016-02-25 DIAGNOSIS — R260 Ataxic gait: Secondary | ICD-10-CM | POA: Diagnosis not present

## 2016-02-27 DIAGNOSIS — G8929 Other chronic pain: Secondary | ICD-10-CM | POA: Diagnosis not present

## 2016-02-27 DIAGNOSIS — I482 Chronic atrial fibrillation: Secondary | ICD-10-CM | POA: Diagnosis not present

## 2016-02-27 DIAGNOSIS — F0390 Unspecified dementia without behavioral disturbance: Secondary | ICD-10-CM | POA: Diagnosis not present

## 2016-02-27 DIAGNOSIS — M810 Age-related osteoporosis without current pathological fracture: Secondary | ICD-10-CM | POA: Diagnosis not present

## 2016-02-27 DIAGNOSIS — R260 Ataxic gait: Secondary | ICD-10-CM | POA: Diagnosis not present

## 2016-02-27 DIAGNOSIS — M25511 Pain in right shoulder: Secondary | ICD-10-CM | POA: Diagnosis not present

## 2016-03-02 DIAGNOSIS — M25511 Pain in right shoulder: Secondary | ICD-10-CM | POA: Diagnosis not present

## 2016-03-02 DIAGNOSIS — F0390 Unspecified dementia without behavioral disturbance: Secondary | ICD-10-CM | POA: Diagnosis not present

## 2016-03-02 DIAGNOSIS — G8929 Other chronic pain: Secondary | ICD-10-CM | POA: Diagnosis not present

## 2016-03-02 DIAGNOSIS — I482 Chronic atrial fibrillation: Secondary | ICD-10-CM | POA: Diagnosis not present

## 2016-03-02 DIAGNOSIS — M810 Age-related osteoporosis without current pathological fracture: Secondary | ICD-10-CM | POA: Diagnosis not present

## 2016-03-02 DIAGNOSIS — R260 Ataxic gait: Secondary | ICD-10-CM | POA: Diagnosis not present

## 2016-03-04 DIAGNOSIS — F0390 Unspecified dementia without behavioral disturbance: Secondary | ICD-10-CM | POA: Diagnosis not present

## 2016-03-04 DIAGNOSIS — M25511 Pain in right shoulder: Secondary | ICD-10-CM | POA: Diagnosis not present

## 2016-03-04 DIAGNOSIS — R260 Ataxic gait: Secondary | ICD-10-CM | POA: Diagnosis not present

## 2016-03-04 DIAGNOSIS — G8929 Other chronic pain: Secondary | ICD-10-CM | POA: Diagnosis not present

## 2016-03-04 DIAGNOSIS — I482 Chronic atrial fibrillation: Secondary | ICD-10-CM | POA: Diagnosis not present

## 2016-03-04 DIAGNOSIS — M810 Age-related osteoporosis without current pathological fracture: Secondary | ICD-10-CM | POA: Diagnosis not present

## 2016-03-09 DIAGNOSIS — F0390 Unspecified dementia without behavioral disturbance: Secondary | ICD-10-CM | POA: Diagnosis not present

## 2016-03-09 DIAGNOSIS — R260 Ataxic gait: Secondary | ICD-10-CM | POA: Diagnosis not present

## 2016-03-09 DIAGNOSIS — G8929 Other chronic pain: Secondary | ICD-10-CM | POA: Diagnosis not present

## 2016-03-09 DIAGNOSIS — M25511 Pain in right shoulder: Secondary | ICD-10-CM | POA: Diagnosis not present

## 2016-03-09 DIAGNOSIS — I482 Chronic atrial fibrillation: Secondary | ICD-10-CM | POA: Diagnosis not present

## 2016-03-09 DIAGNOSIS — M810 Age-related osteoporosis without current pathological fracture: Secondary | ICD-10-CM | POA: Diagnosis not present

## 2016-03-11 DIAGNOSIS — F0390 Unspecified dementia without behavioral disturbance: Secondary | ICD-10-CM | POA: Diagnosis not present

## 2016-03-11 DIAGNOSIS — I482 Chronic atrial fibrillation: Secondary | ICD-10-CM | POA: Diagnosis not present

## 2016-03-11 DIAGNOSIS — G8929 Other chronic pain: Secondary | ICD-10-CM | POA: Diagnosis not present

## 2016-03-11 DIAGNOSIS — M25511 Pain in right shoulder: Secondary | ICD-10-CM | POA: Diagnosis not present

## 2016-03-11 DIAGNOSIS — R260 Ataxic gait: Secondary | ICD-10-CM | POA: Diagnosis not present

## 2016-03-11 DIAGNOSIS — M810 Age-related osteoporosis without current pathological fracture: Secondary | ICD-10-CM | POA: Diagnosis not present

## 2016-03-16 DIAGNOSIS — F0390 Unspecified dementia without behavioral disturbance: Secondary | ICD-10-CM | POA: Diagnosis not present

## 2016-03-16 DIAGNOSIS — G8929 Other chronic pain: Secondary | ICD-10-CM | POA: Diagnosis not present

## 2016-03-16 DIAGNOSIS — M810 Age-related osteoporosis without current pathological fracture: Secondary | ICD-10-CM | POA: Diagnosis not present

## 2016-03-16 DIAGNOSIS — M25511 Pain in right shoulder: Secondary | ICD-10-CM | POA: Diagnosis not present

## 2016-03-16 DIAGNOSIS — R260 Ataxic gait: Secondary | ICD-10-CM | POA: Diagnosis not present

## 2016-03-16 DIAGNOSIS — I482 Chronic atrial fibrillation: Secondary | ICD-10-CM | POA: Diagnosis not present

## 2016-03-18 DIAGNOSIS — M810 Age-related osteoporosis without current pathological fracture: Secondary | ICD-10-CM | POA: Diagnosis not present

## 2016-03-18 DIAGNOSIS — M25511 Pain in right shoulder: Secondary | ICD-10-CM | POA: Diagnosis not present

## 2016-03-18 DIAGNOSIS — G8929 Other chronic pain: Secondary | ICD-10-CM | POA: Diagnosis not present

## 2016-03-18 DIAGNOSIS — I482 Chronic atrial fibrillation: Secondary | ICD-10-CM | POA: Diagnosis not present

## 2016-03-18 DIAGNOSIS — R260 Ataxic gait: Secondary | ICD-10-CM | POA: Diagnosis not present

## 2016-03-18 DIAGNOSIS — F0390 Unspecified dementia without behavioral disturbance: Secondary | ICD-10-CM | POA: Diagnosis not present

## 2016-03-23 DIAGNOSIS — I482 Chronic atrial fibrillation: Secondary | ICD-10-CM | POA: Diagnosis not present

## 2016-03-23 DIAGNOSIS — M810 Age-related osteoporosis without current pathological fracture: Secondary | ICD-10-CM | POA: Diagnosis not present

## 2016-03-23 DIAGNOSIS — R260 Ataxic gait: Secondary | ICD-10-CM | POA: Diagnosis not present

## 2016-03-23 DIAGNOSIS — F0390 Unspecified dementia without behavioral disturbance: Secondary | ICD-10-CM | POA: Diagnosis not present

## 2016-03-23 DIAGNOSIS — G8929 Other chronic pain: Secondary | ICD-10-CM | POA: Diagnosis not present

## 2016-03-23 DIAGNOSIS — M25511 Pain in right shoulder: Secondary | ICD-10-CM | POA: Diagnosis not present

## 2016-03-25 DIAGNOSIS — M25511 Pain in right shoulder: Secondary | ICD-10-CM | POA: Diagnosis not present

## 2016-03-25 DIAGNOSIS — F0390 Unspecified dementia without behavioral disturbance: Secondary | ICD-10-CM | POA: Diagnosis not present

## 2016-03-25 DIAGNOSIS — M810 Age-related osteoporosis without current pathological fracture: Secondary | ICD-10-CM | POA: Diagnosis not present

## 2016-03-25 DIAGNOSIS — G8929 Other chronic pain: Secondary | ICD-10-CM | POA: Diagnosis not present

## 2016-03-25 DIAGNOSIS — I482 Chronic atrial fibrillation: Secondary | ICD-10-CM | POA: Diagnosis not present

## 2016-03-25 DIAGNOSIS — R260 Ataxic gait: Secondary | ICD-10-CM | POA: Diagnosis not present

## 2016-03-30 DIAGNOSIS — M810 Age-related osteoporosis without current pathological fracture: Secondary | ICD-10-CM | POA: Diagnosis not present

## 2016-03-30 DIAGNOSIS — F0390 Unspecified dementia without behavioral disturbance: Secondary | ICD-10-CM | POA: Diagnosis not present

## 2016-03-30 DIAGNOSIS — R260 Ataxic gait: Secondary | ICD-10-CM | POA: Diagnosis not present

## 2016-03-30 DIAGNOSIS — M25511 Pain in right shoulder: Secondary | ICD-10-CM | POA: Diagnosis not present

## 2016-03-30 DIAGNOSIS — Z23 Encounter for immunization: Secondary | ICD-10-CM | POA: Diagnosis not present

## 2016-03-30 DIAGNOSIS — M25519 Pain in unspecified shoulder: Secondary | ICD-10-CM | POA: Diagnosis not present

## 2016-03-30 DIAGNOSIS — Z681 Body mass index (BMI) 19 or less, adult: Secondary | ICD-10-CM | POA: Diagnosis not present

## 2016-03-30 DIAGNOSIS — G8929 Other chronic pain: Secondary | ICD-10-CM | POA: Diagnosis not present

## 2016-03-30 DIAGNOSIS — I482 Chronic atrial fibrillation: Secondary | ICD-10-CM | POA: Diagnosis not present

## 2016-03-31 DIAGNOSIS — M810 Age-related osteoporosis without current pathological fracture: Secondary | ICD-10-CM | POA: Diagnosis not present

## 2016-03-31 DIAGNOSIS — F0390 Unspecified dementia without behavioral disturbance: Secondary | ICD-10-CM | POA: Diagnosis not present

## 2016-03-31 DIAGNOSIS — R260 Ataxic gait: Secondary | ICD-10-CM | POA: Diagnosis not present

## 2016-03-31 DIAGNOSIS — M25511 Pain in right shoulder: Secondary | ICD-10-CM | POA: Diagnosis not present

## 2016-03-31 DIAGNOSIS — G8929 Other chronic pain: Secondary | ICD-10-CM | POA: Diagnosis not present

## 2016-03-31 DIAGNOSIS — I482 Chronic atrial fibrillation: Secondary | ICD-10-CM | POA: Diagnosis not present

## 2016-04-06 DIAGNOSIS — M25511 Pain in right shoulder: Secondary | ICD-10-CM | POA: Diagnosis not present

## 2016-04-06 DIAGNOSIS — R260 Ataxic gait: Secondary | ICD-10-CM | POA: Diagnosis not present

## 2016-04-06 DIAGNOSIS — I482 Chronic atrial fibrillation: Secondary | ICD-10-CM | POA: Diagnosis not present

## 2016-04-06 DIAGNOSIS — G8929 Other chronic pain: Secondary | ICD-10-CM | POA: Diagnosis not present

## 2016-04-06 DIAGNOSIS — F0390 Unspecified dementia without behavioral disturbance: Secondary | ICD-10-CM | POA: Diagnosis not present

## 2016-04-06 DIAGNOSIS — M810 Age-related osteoporosis without current pathological fracture: Secondary | ICD-10-CM | POA: Diagnosis not present

## 2016-04-08 DIAGNOSIS — G8929 Other chronic pain: Secondary | ICD-10-CM | POA: Diagnosis not present

## 2016-04-08 DIAGNOSIS — I482 Chronic atrial fibrillation: Secondary | ICD-10-CM | POA: Diagnosis not present

## 2016-04-08 DIAGNOSIS — R260 Ataxic gait: Secondary | ICD-10-CM | POA: Diagnosis not present

## 2016-04-08 DIAGNOSIS — F0390 Unspecified dementia without behavioral disturbance: Secondary | ICD-10-CM | POA: Diagnosis not present

## 2016-04-08 DIAGNOSIS — M810 Age-related osteoporosis without current pathological fracture: Secondary | ICD-10-CM | POA: Diagnosis not present

## 2016-04-08 DIAGNOSIS — M25511 Pain in right shoulder: Secondary | ICD-10-CM | POA: Diagnosis not present

## 2016-06-18 DIAGNOSIS — E782 Mixed hyperlipidemia: Secondary | ICD-10-CM | POA: Diagnosis not present

## 2016-06-18 DIAGNOSIS — E039 Hypothyroidism, unspecified: Secondary | ICD-10-CM | POA: Diagnosis not present

## 2016-06-18 DIAGNOSIS — D509 Iron deficiency anemia, unspecified: Secondary | ICD-10-CM | POA: Diagnosis not present

## 2016-06-24 DIAGNOSIS — E039 Hypothyroidism, unspecified: Secondary | ICD-10-CM | POA: Diagnosis not present

## 2016-06-24 DIAGNOSIS — E782 Mixed hyperlipidemia: Secondary | ICD-10-CM | POA: Diagnosis not present

## 2016-06-24 DIAGNOSIS — G8929 Other chronic pain: Secondary | ICD-10-CM | POA: Diagnosis not present

## 2016-06-24 DIAGNOSIS — D509 Iron deficiency anemia, unspecified: Secondary | ICD-10-CM | POA: Diagnosis not present

## 2016-06-24 DIAGNOSIS — Z0001 Encounter for general adult medical examination with abnormal findings: Secondary | ICD-10-CM | POA: Diagnosis not present

## 2016-06-24 DIAGNOSIS — F0390 Unspecified dementia without behavioral disturbance: Secondary | ICD-10-CM | POA: Diagnosis not present

## 2016-06-24 DIAGNOSIS — I482 Chronic atrial fibrillation: Secondary | ICD-10-CM | POA: Diagnosis not present

## 2016-07-05 DIAGNOSIS — I482 Chronic atrial fibrillation: Secondary | ICD-10-CM | POA: Diagnosis not present

## 2016-07-05 DIAGNOSIS — R2689 Other abnormalities of gait and mobility: Secondary | ICD-10-CM | POA: Diagnosis not present

## 2016-07-05 DIAGNOSIS — M25511 Pain in right shoulder: Secondary | ICD-10-CM | POA: Diagnosis not present

## 2016-07-05 DIAGNOSIS — M810 Age-related osteoporosis without current pathological fracture: Secondary | ICD-10-CM | POA: Diagnosis not present

## 2016-07-05 DIAGNOSIS — F0390 Unspecified dementia without behavioral disturbance: Secondary | ICD-10-CM | POA: Diagnosis not present

## 2016-07-07 DIAGNOSIS — I482 Chronic atrial fibrillation: Secondary | ICD-10-CM | POA: Diagnosis not present

## 2016-07-07 DIAGNOSIS — M810 Age-related osteoporosis without current pathological fracture: Secondary | ICD-10-CM | POA: Diagnosis not present

## 2016-07-07 DIAGNOSIS — R2689 Other abnormalities of gait and mobility: Secondary | ICD-10-CM | POA: Diagnosis not present

## 2016-07-07 DIAGNOSIS — F0390 Unspecified dementia without behavioral disturbance: Secondary | ICD-10-CM | POA: Diagnosis not present

## 2016-07-07 DIAGNOSIS — M25511 Pain in right shoulder: Secondary | ICD-10-CM | POA: Diagnosis not present

## 2016-07-13 DIAGNOSIS — M25511 Pain in right shoulder: Secondary | ICD-10-CM | POA: Diagnosis not present

## 2016-07-13 DIAGNOSIS — R2689 Other abnormalities of gait and mobility: Secondary | ICD-10-CM | POA: Diagnosis not present

## 2016-07-13 DIAGNOSIS — F0390 Unspecified dementia without behavioral disturbance: Secondary | ICD-10-CM | POA: Diagnosis not present

## 2016-07-13 DIAGNOSIS — I482 Chronic atrial fibrillation: Secondary | ICD-10-CM | POA: Diagnosis not present

## 2016-07-13 DIAGNOSIS — M810 Age-related osteoporosis without current pathological fracture: Secondary | ICD-10-CM | POA: Diagnosis not present

## 2016-07-15 DIAGNOSIS — M25511 Pain in right shoulder: Secondary | ICD-10-CM | POA: Diagnosis not present

## 2016-07-15 DIAGNOSIS — M810 Age-related osteoporosis without current pathological fracture: Secondary | ICD-10-CM | POA: Diagnosis not present

## 2016-07-15 DIAGNOSIS — I482 Chronic atrial fibrillation: Secondary | ICD-10-CM | POA: Diagnosis not present

## 2016-07-15 DIAGNOSIS — R2689 Other abnormalities of gait and mobility: Secondary | ICD-10-CM | POA: Diagnosis not present

## 2016-07-15 DIAGNOSIS — F0390 Unspecified dementia without behavioral disturbance: Secondary | ICD-10-CM | POA: Diagnosis not present

## 2016-07-20 DIAGNOSIS — R2689 Other abnormalities of gait and mobility: Secondary | ICD-10-CM | POA: Diagnosis not present

## 2016-07-20 DIAGNOSIS — I482 Chronic atrial fibrillation: Secondary | ICD-10-CM | POA: Diagnosis not present

## 2016-07-20 DIAGNOSIS — M25511 Pain in right shoulder: Secondary | ICD-10-CM | POA: Diagnosis not present

## 2016-07-20 DIAGNOSIS — F0390 Unspecified dementia without behavioral disturbance: Secondary | ICD-10-CM | POA: Diagnosis not present

## 2016-07-20 DIAGNOSIS — M810 Age-related osteoporosis without current pathological fracture: Secondary | ICD-10-CM | POA: Diagnosis not present

## 2016-07-30 DIAGNOSIS — M810 Age-related osteoporosis without current pathological fracture: Secondary | ICD-10-CM | POA: Diagnosis not present

## 2016-07-30 DIAGNOSIS — M25511 Pain in right shoulder: Secondary | ICD-10-CM | POA: Diagnosis not present

## 2016-07-30 DIAGNOSIS — I482 Chronic atrial fibrillation: Secondary | ICD-10-CM | POA: Diagnosis not present

## 2016-07-30 DIAGNOSIS — F0390 Unspecified dementia without behavioral disturbance: Secondary | ICD-10-CM | POA: Diagnosis not present

## 2016-07-30 DIAGNOSIS — R2689 Other abnormalities of gait and mobility: Secondary | ICD-10-CM | POA: Diagnosis not present

## 2016-08-03 DIAGNOSIS — M25511 Pain in right shoulder: Secondary | ICD-10-CM | POA: Diagnosis not present

## 2016-08-03 DIAGNOSIS — R2689 Other abnormalities of gait and mobility: Secondary | ICD-10-CM | POA: Diagnosis not present

## 2016-08-03 DIAGNOSIS — F0390 Unspecified dementia without behavioral disturbance: Secondary | ICD-10-CM | POA: Diagnosis not present

## 2016-08-03 DIAGNOSIS — I482 Chronic atrial fibrillation: Secondary | ICD-10-CM | POA: Diagnosis not present

## 2016-08-03 DIAGNOSIS — M810 Age-related osteoporosis without current pathological fracture: Secondary | ICD-10-CM | POA: Diagnosis not present

## 2016-08-05 DIAGNOSIS — M25511 Pain in right shoulder: Secondary | ICD-10-CM | POA: Diagnosis not present

## 2016-08-05 DIAGNOSIS — R2689 Other abnormalities of gait and mobility: Secondary | ICD-10-CM | POA: Diagnosis not present

## 2016-08-05 DIAGNOSIS — M810 Age-related osteoporosis without current pathological fracture: Secondary | ICD-10-CM | POA: Diagnosis not present

## 2016-08-05 DIAGNOSIS — F0390 Unspecified dementia without behavioral disturbance: Secondary | ICD-10-CM | POA: Diagnosis not present

## 2016-08-05 DIAGNOSIS — I482 Chronic atrial fibrillation: Secondary | ICD-10-CM | POA: Diagnosis not present

## 2016-08-10 DIAGNOSIS — R2689 Other abnormalities of gait and mobility: Secondary | ICD-10-CM | POA: Diagnosis not present

## 2016-08-10 DIAGNOSIS — F0390 Unspecified dementia without behavioral disturbance: Secondary | ICD-10-CM | POA: Diagnosis not present

## 2016-08-10 DIAGNOSIS — I482 Chronic atrial fibrillation: Secondary | ICD-10-CM | POA: Diagnosis not present

## 2016-08-10 DIAGNOSIS — M25511 Pain in right shoulder: Secondary | ICD-10-CM | POA: Diagnosis not present

## 2016-08-10 DIAGNOSIS — M810 Age-related osteoporosis without current pathological fracture: Secondary | ICD-10-CM | POA: Diagnosis not present

## 2016-08-12 DIAGNOSIS — M810 Age-related osteoporosis without current pathological fracture: Secondary | ICD-10-CM | POA: Diagnosis not present

## 2016-08-12 DIAGNOSIS — F0390 Unspecified dementia without behavioral disturbance: Secondary | ICD-10-CM | POA: Diagnosis not present

## 2016-08-12 DIAGNOSIS — I482 Chronic atrial fibrillation: Secondary | ICD-10-CM | POA: Diagnosis not present

## 2016-08-12 DIAGNOSIS — M25511 Pain in right shoulder: Secondary | ICD-10-CM | POA: Diagnosis not present

## 2016-08-12 DIAGNOSIS — R2689 Other abnormalities of gait and mobility: Secondary | ICD-10-CM | POA: Diagnosis not present

## 2016-08-17 DIAGNOSIS — M25511 Pain in right shoulder: Secondary | ICD-10-CM | POA: Diagnosis not present

## 2016-08-17 DIAGNOSIS — F0390 Unspecified dementia without behavioral disturbance: Secondary | ICD-10-CM | POA: Diagnosis not present

## 2016-08-17 DIAGNOSIS — R2689 Other abnormalities of gait and mobility: Secondary | ICD-10-CM | POA: Diagnosis not present

## 2016-08-17 DIAGNOSIS — I482 Chronic atrial fibrillation: Secondary | ICD-10-CM | POA: Diagnosis not present

## 2016-08-17 DIAGNOSIS — M810 Age-related osteoporosis without current pathological fracture: Secondary | ICD-10-CM | POA: Diagnosis not present

## 2016-08-26 DIAGNOSIS — M810 Age-related osteoporosis without current pathological fracture: Secondary | ICD-10-CM | POA: Diagnosis not present

## 2016-08-26 DIAGNOSIS — F0390 Unspecified dementia without behavioral disturbance: Secondary | ICD-10-CM | POA: Diagnosis not present

## 2016-08-26 DIAGNOSIS — I482 Chronic atrial fibrillation: Secondary | ICD-10-CM | POA: Diagnosis not present

## 2016-08-26 DIAGNOSIS — R2689 Other abnormalities of gait and mobility: Secondary | ICD-10-CM | POA: Diagnosis not present

## 2016-08-26 DIAGNOSIS — M25511 Pain in right shoulder: Secondary | ICD-10-CM | POA: Diagnosis not present

## 2016-08-27 DIAGNOSIS — M25511 Pain in right shoulder: Secondary | ICD-10-CM | POA: Diagnosis not present

## 2016-08-27 DIAGNOSIS — R2689 Other abnormalities of gait and mobility: Secondary | ICD-10-CM | POA: Diagnosis not present

## 2016-08-27 DIAGNOSIS — M810 Age-related osteoporosis without current pathological fracture: Secondary | ICD-10-CM | POA: Diagnosis not present

## 2016-08-27 DIAGNOSIS — F0390 Unspecified dementia without behavioral disturbance: Secondary | ICD-10-CM | POA: Diagnosis not present

## 2016-08-27 DIAGNOSIS — I482 Chronic atrial fibrillation: Secondary | ICD-10-CM | POA: Diagnosis not present

## 2016-09-01 DIAGNOSIS — M25511 Pain in right shoulder: Secondary | ICD-10-CM | POA: Diagnosis not present

## 2016-09-01 DIAGNOSIS — R2689 Other abnormalities of gait and mobility: Secondary | ICD-10-CM | POA: Diagnosis not present

## 2016-09-01 DIAGNOSIS — F0390 Unspecified dementia without behavioral disturbance: Secondary | ICD-10-CM | POA: Diagnosis not present

## 2016-09-01 DIAGNOSIS — M810 Age-related osteoporosis without current pathological fracture: Secondary | ICD-10-CM | POA: Diagnosis not present

## 2016-09-01 DIAGNOSIS — I482 Chronic atrial fibrillation: Secondary | ICD-10-CM | POA: Diagnosis not present

## 2016-09-02 DIAGNOSIS — I482 Chronic atrial fibrillation: Secondary | ICD-10-CM | POA: Diagnosis not present

## 2016-09-02 DIAGNOSIS — M25511 Pain in right shoulder: Secondary | ICD-10-CM | POA: Diagnosis not present

## 2016-09-02 DIAGNOSIS — F0390 Unspecified dementia without behavioral disturbance: Secondary | ICD-10-CM | POA: Diagnosis not present

## 2016-09-02 DIAGNOSIS — M810 Age-related osteoporosis without current pathological fracture: Secondary | ICD-10-CM | POA: Diagnosis not present

## 2016-09-02 DIAGNOSIS — R2689 Other abnormalities of gait and mobility: Secondary | ICD-10-CM | POA: Diagnosis not present

## 2016-12-02 DIAGNOSIS — H04123 Dry eye syndrome of bilateral lacrimal glands: Secondary | ICD-10-CM | POA: Diagnosis not present

## 2016-12-02 DIAGNOSIS — H40013 Open angle with borderline findings, low risk, bilateral: Secondary | ICD-10-CM | POA: Diagnosis not present

## 2016-12-02 DIAGNOSIS — Z961 Presence of intraocular lens: Secondary | ICD-10-CM | POA: Diagnosis not present

## 2016-12-02 DIAGNOSIS — H353132 Nonexudative age-related macular degeneration, bilateral, intermediate dry stage: Secondary | ICD-10-CM | POA: Diagnosis not present

## 2016-12-31 DIAGNOSIS — I482 Chronic atrial fibrillation: Secondary | ICD-10-CM | POA: Diagnosis not present

## 2016-12-31 DIAGNOSIS — F0390 Unspecified dementia without behavioral disturbance: Secondary | ICD-10-CM | POA: Diagnosis not present

## 2016-12-31 DIAGNOSIS — G8929 Other chronic pain: Secondary | ICD-10-CM | POA: Diagnosis not present

## 2016-12-31 DIAGNOSIS — H6120 Impacted cerumen, unspecified ear: Secondary | ICD-10-CM | POA: Diagnosis not present

## 2016-12-31 DIAGNOSIS — E039 Hypothyroidism, unspecified: Secondary | ICD-10-CM | POA: Diagnosis not present

## 2016-12-31 DIAGNOSIS — D509 Iron deficiency anemia, unspecified: Secondary | ICD-10-CM | POA: Diagnosis not present

## 2016-12-31 DIAGNOSIS — H04129 Dry eye syndrome of unspecified lacrimal gland: Secondary | ICD-10-CM | POA: Diagnosis not present

## 2016-12-31 DIAGNOSIS — E782 Mixed hyperlipidemia: Secondary | ICD-10-CM | POA: Diagnosis not present

## 2017-01-01 DIAGNOSIS — R2689 Other abnormalities of gait and mobility: Secondary | ICD-10-CM | POA: Diagnosis not present

## 2017-01-01 DIAGNOSIS — D509 Iron deficiency anemia, unspecified: Secondary | ICD-10-CM | POA: Diagnosis not present

## 2017-01-01 DIAGNOSIS — D696 Thrombocytopenia, unspecified: Secondary | ICD-10-CM | POA: Diagnosis not present

## 2017-01-01 DIAGNOSIS — F99 Mental disorder, not otherwise specified: Secondary | ICD-10-CM | POA: Diagnosis not present

## 2017-01-01 DIAGNOSIS — F0390 Unspecified dementia without behavioral disturbance: Secondary | ICD-10-CM | POA: Diagnosis not present

## 2017-01-01 DIAGNOSIS — M6281 Muscle weakness (generalized): Secondary | ICD-10-CM | POA: Diagnosis not present

## 2017-01-01 DIAGNOSIS — R2681 Unsteadiness on feet: Secondary | ICD-10-CM | POA: Diagnosis not present

## 2017-01-01 DIAGNOSIS — I482 Chronic atrial fibrillation: Secondary | ICD-10-CM | POA: Diagnosis not present

## 2017-01-01 DIAGNOSIS — E039 Hypothyroidism, unspecified: Secondary | ICD-10-CM | POA: Diagnosis not present

## 2017-01-01 DIAGNOSIS — S8011XD Contusion of right lower leg, subsequent encounter: Secondary | ICD-10-CM | POA: Diagnosis not present

## 2017-01-01 DIAGNOSIS — M816 Localized osteoporosis [Lequesne]: Secondary | ICD-10-CM | POA: Diagnosis not present

## 2017-01-01 DIAGNOSIS — E782 Mixed hyperlipidemia: Secondary | ICD-10-CM | POA: Diagnosis not present

## 2017-01-01 DIAGNOSIS — K219 Gastro-esophageal reflux disease without esophagitis: Secondary | ICD-10-CM | POA: Diagnosis not present

## 2017-01-04 DIAGNOSIS — M6281 Muscle weakness (generalized): Secondary | ICD-10-CM | POA: Diagnosis not present

## 2017-01-04 DIAGNOSIS — R2681 Unsteadiness on feet: Secondary | ICD-10-CM | POA: Diagnosis not present

## 2017-01-04 DIAGNOSIS — I482 Chronic atrial fibrillation: Secondary | ICD-10-CM | POA: Diagnosis not present

## 2017-01-04 DIAGNOSIS — F0390 Unspecified dementia without behavioral disturbance: Secondary | ICD-10-CM | POA: Diagnosis not present

## 2017-01-07 DIAGNOSIS — I482 Chronic atrial fibrillation: Secondary | ICD-10-CM | POA: Diagnosis not present

## 2017-01-07 DIAGNOSIS — F0390 Unspecified dementia without behavioral disturbance: Secondary | ICD-10-CM | POA: Diagnosis not present

## 2017-01-07 DIAGNOSIS — M6281 Muscle weakness (generalized): Secondary | ICD-10-CM | POA: Diagnosis not present

## 2017-01-07 DIAGNOSIS — R2681 Unsteadiness on feet: Secondary | ICD-10-CM | POA: Diagnosis not present

## 2017-01-11 DIAGNOSIS — F0390 Unspecified dementia without behavioral disturbance: Secondary | ICD-10-CM | POA: Diagnosis not present

## 2017-01-11 DIAGNOSIS — M6281 Muscle weakness (generalized): Secondary | ICD-10-CM | POA: Diagnosis not present

## 2017-01-11 DIAGNOSIS — R2681 Unsteadiness on feet: Secondary | ICD-10-CM | POA: Diagnosis not present

## 2017-01-11 DIAGNOSIS — I482 Chronic atrial fibrillation: Secondary | ICD-10-CM | POA: Diagnosis not present

## 2017-01-13 DIAGNOSIS — M6281 Muscle weakness (generalized): Secondary | ICD-10-CM | POA: Diagnosis not present

## 2017-01-13 DIAGNOSIS — I482 Chronic atrial fibrillation: Secondary | ICD-10-CM | POA: Diagnosis not present

## 2017-01-13 DIAGNOSIS — R2681 Unsteadiness on feet: Secondary | ICD-10-CM | POA: Diagnosis not present

## 2017-01-13 DIAGNOSIS — F0390 Unspecified dementia without behavioral disturbance: Secondary | ICD-10-CM | POA: Diagnosis not present

## 2017-01-18 DIAGNOSIS — M6281 Muscle weakness (generalized): Secondary | ICD-10-CM | POA: Diagnosis not present

## 2017-01-18 DIAGNOSIS — F0390 Unspecified dementia without behavioral disturbance: Secondary | ICD-10-CM | POA: Diagnosis not present

## 2017-01-18 DIAGNOSIS — R2681 Unsteadiness on feet: Secondary | ICD-10-CM | POA: Diagnosis not present

## 2017-01-18 DIAGNOSIS — I482 Chronic atrial fibrillation: Secondary | ICD-10-CM | POA: Diagnosis not present

## 2017-01-19 DIAGNOSIS — I482 Chronic atrial fibrillation: Secondary | ICD-10-CM | POA: Diagnosis not present

## 2017-01-19 DIAGNOSIS — M6281 Muscle weakness (generalized): Secondary | ICD-10-CM | POA: Diagnosis not present

## 2017-01-19 DIAGNOSIS — R2681 Unsteadiness on feet: Secondary | ICD-10-CM | POA: Diagnosis not present

## 2017-01-19 DIAGNOSIS — F0390 Unspecified dementia without behavioral disturbance: Secondary | ICD-10-CM | POA: Diagnosis not present

## 2017-01-26 DIAGNOSIS — I482 Chronic atrial fibrillation: Secondary | ICD-10-CM | POA: Diagnosis not present

## 2017-01-26 DIAGNOSIS — F0390 Unspecified dementia without behavioral disturbance: Secondary | ICD-10-CM | POA: Diagnosis not present

## 2017-01-26 DIAGNOSIS — M6281 Muscle weakness (generalized): Secondary | ICD-10-CM | POA: Diagnosis not present

## 2017-01-26 DIAGNOSIS — R2681 Unsteadiness on feet: Secondary | ICD-10-CM | POA: Diagnosis not present

## 2017-01-28 DIAGNOSIS — R2681 Unsteadiness on feet: Secondary | ICD-10-CM | POA: Diagnosis not present

## 2017-01-28 DIAGNOSIS — M6281 Muscle weakness (generalized): Secondary | ICD-10-CM | POA: Diagnosis not present

## 2017-01-28 DIAGNOSIS — I482 Chronic atrial fibrillation: Secondary | ICD-10-CM | POA: Diagnosis not present

## 2017-01-28 DIAGNOSIS — F0390 Unspecified dementia without behavioral disturbance: Secondary | ICD-10-CM | POA: Diagnosis not present

## 2017-02-01 DIAGNOSIS — R2681 Unsteadiness on feet: Secondary | ICD-10-CM | POA: Diagnosis not present

## 2017-02-01 DIAGNOSIS — M6281 Muscle weakness (generalized): Secondary | ICD-10-CM | POA: Diagnosis not present

## 2017-02-01 DIAGNOSIS — F0390 Unspecified dementia without behavioral disturbance: Secondary | ICD-10-CM | POA: Diagnosis not present

## 2017-02-01 DIAGNOSIS — I482 Chronic atrial fibrillation: Secondary | ICD-10-CM | POA: Diagnosis not present

## 2017-02-02 ENCOUNTER — Encounter (HOSPITAL_COMMUNITY): Payer: Self-pay | Admitting: Emergency Medicine

## 2017-02-02 ENCOUNTER — Emergency Department (HOSPITAL_COMMUNITY): Payer: Medicare Other

## 2017-02-02 ENCOUNTER — Emergency Department (HOSPITAL_COMMUNITY)
Admission: EM | Admit: 2017-02-02 | Discharge: 2017-02-02 | Disposition: A | Discharge status: Home / Self Care | Payer: Medicare Other | Attending: Emergency Medicine | Admitting: Emergency Medicine

## 2017-02-02 DIAGNOSIS — Z7982 Long term (current) use of aspirin: Secondary | ICD-10-CM | POA: Insufficient documentation

## 2017-02-02 DIAGNOSIS — Z79899 Other long term (current) drug therapy: Secondary | ICD-10-CM | POA: Insufficient documentation

## 2017-02-02 DIAGNOSIS — R079 Chest pain, unspecified: Secondary | ICD-10-CM | POA: Insufficient documentation

## 2017-02-02 DIAGNOSIS — R0789 Other chest pain: Secondary | ICD-10-CM | POA: Diagnosis not present

## 2017-02-02 DIAGNOSIS — F79 Unspecified intellectual disabilities: Secondary | ICD-10-CM | POA: Insufficient documentation

## 2017-02-02 DIAGNOSIS — J189 Pneumonia, unspecified organism: Secondary | ICD-10-CM | POA: Diagnosis not present

## 2017-02-02 DIAGNOSIS — R001 Bradycardia, unspecified: Secondary | ICD-10-CM | POA: Diagnosis not present

## 2017-02-02 DIAGNOSIS — G309 Alzheimer's disease, unspecified: Secondary | ICD-10-CM | POA: Insufficient documentation

## 2017-02-02 LAB — BASIC METABOLIC PANEL
Anion gap: 7 (ref 5–15)
BUN: 24 mg/dL — AB (ref 6–20)
CALCIUM: 8.8 mg/dL — AB (ref 8.9–10.3)
CO2: 26 mmol/L (ref 22–32)
Chloride: 101 mmol/L (ref 101–111)
Creatinine, Ser: 1.04 mg/dL — ABNORMAL HIGH (ref 0.44–1.00)
GFR calc Af Amer: 55 mL/min — ABNORMAL LOW (ref >60)
GFR, EST NON AFRICAN AMERICAN: 47 mL/min — AB (ref >60)
Glucose, Bld: 89 mg/dL (ref 65–99)
POTASSIUM: 4.1 mmol/L (ref 3.5–5.1)
Sodium: 134 mmol/L — ABNORMAL LOW (ref 135–145)

## 2017-02-02 LAB — CBC WITH DIFFERENTIAL/PLATELET
BASOS PCT: 0 %
Basophils Absolute: 0 10*3/uL (ref 0.0–0.1)
EOS ABS: 0.1 10*3/uL (ref 0.0–0.7)
EOS PCT: 2 %
HEMATOCRIT: 39 % (ref 36.0–46.0)
Hemoglobin: 13 g/dL (ref 12.0–15.0)
Lymphocytes Relative: 13 %
Lymphs Abs: 0.8 10*3/uL (ref 0.7–4.0)
MCH: 29.9 pg (ref 26.0–34.0)
MCHC: 33.3 g/dL (ref 30.0–36.0)
MCV: 89.7 fL (ref 78.0–100.0)
MONO ABS: 1 10*3/uL (ref 0.1–1.0)
Monocytes Relative: 15 %
Neutro Abs: 4.6 10*3/uL (ref 1.7–7.7)
Neutrophils Relative %: 70 %
Platelets: 166 10*3/uL (ref 150–400)
RBC: 4.35 MIL/uL (ref 3.87–5.11)
RDW: 14.6 % (ref 11.5–15.5)
WBC: 6.6 10*3/uL (ref 4.0–10.5)

## 2017-02-02 LAB — TROPONIN I

## 2017-02-02 MED ORDER — LEVOFLOXACIN 25 MG/ML PO SOLN: 500.0000 mg | Freq: Every day | ORAL | 0 refills | Status: DC

## 2017-02-02 MED ORDER — LEVOFLOXACIN 25 MG/ML PO SOLN
500.0000 mg | Freq: Every day | ORAL | Status: DC
  Filled 2017-02-02 (×2): qty 20

## 2017-02-02 MED ORDER — LEVOFLOXACIN 500 MG PO TABS
500.0000 mg | ORAL_TABLET | Freq: Once | ORAL | Status: AC
  Administered 2017-02-02: 500 mg via ORAL
  Filled 2017-02-02: qty 1

## 2017-02-02 NOTE — ED Notes (Signed)
AC aware needing meds 

## 2017-02-02 NOTE — ED Notes (Signed)
Pt taken to xray 

## 2017-02-02 NOTE — ED Triage Notes (Signed)
Pt from [facility]. Cp with inhalation x 2 days. Alert/oriented to some. Pt at her baseline per [facility]/ems

## 2017-02-02 NOTE — Discharge Instructions (Signed)
Test showed no evidence for a heart attack. Your chest x-ray did show possibility of pneumonia in the left lung base. Prescription for antibiotic. Tylenol for pain. Follow-up your primary care doctor or return if worse.

## 2017-02-02 NOTE — ED Provider Notes (Signed)
[facility]-EMERGENCY DEPT Provider Note   CSN: [id number] Arrival date & time: 02/02/17  1448     History   Chief Complaint Chief Complaint  Patient presents with  . Chest Pain    HPI [name] [name] is a 83 y.o. female.  Level V caveat for dementia.  Most of history obtained from friend.Patient complains of intermittent chest pain for 2 days.no crushing substernal chest pain, dyspnea, diaphoresis, cough, fever, nausea.  Nursing staff reports baseline behavior at nursing home. Family reports normal appearance.      Past Medical History:  Diagnosis Date  . Alzheimer's dementia   . Anemia    Gastritis/duodenitis  . Atrial fibrillation (HCC)   . Diverticulosis   . Hx of colonic polyps 2010   2010: Colonoscopy and polypectomy; diverticula also noted  . Hyperlipidemia   . Mental retardation   . Osteoarthritis   . Pneumonia 01/1999 and  . Seizure disorder [facility](HCC)     Patient Active Problem List   Diagnosis Date Noted  . Thrombocytopenia (HCC) 11/16/2012  . Conjunctivitis, allergic 11/16/2012  . Laboratory test 07/14/2012  . Hyperlipidemia 01/22/2011  . Anemia, normocytic normochromic-resolved 02/05/2009  . Atrial fibrillation (HCC) 02/05/2009  . SEIZURE DISORDER 02/05/2009    Past Surgical History:  Procedure Laterality Date  . CHOLECYSTECTOMY    . COLONOSCOPY W/ POLYPECTOMY  01/18/2009    simple adenomas/moderate diverticulosis, torturous colon, normal TI, vascular prominence near dentate line.  . ESOPHAGOGASTRODUODENOSCOPY  01/18/2009   Severe chronic gastritis, peptic duodenitis  . HEMORRHOID SURGERY      OB History    No data available       Home Medications    Prior to Admission medications   Medication Sig Start Date End Date Taking? Authorizing Provider  amiodarone (PACERONE) 200 MG tablet TAKE 1 TABLET BY MOUTH ONCE DAILY. 07/02/14  Yes Jodelle GrossLawrence, Kathryn M, NP  aspirin 81 MG EC tablet Take 81 mg by mouth every morning.    Yes [provider]    beta carotene w/minerals (OCUVITE) tablet Take 1 tablet by mouth daily.   Yes [provider]  Calcium Carbonate-Vit D-Min (CALTRATE 600+D PLUS) 600-400 MG-UNIT per tablet Take 1 tablet by mouth at bedtime.    Yes [provider]  diclofenac sodium (VOLTAREN) 1 % GEL Apply 2 g topically 2 (two) times daily. Apply 2g to right shoulder twice a day for pain   Yes [provider]  docusate sodium (COLACE) 100 MG capsule Take 100 mg by mouth 2 (two) times daily.    Yes [provider]  donepezil (ARICEPT) 5 MG tablet Take 5 mg by mouth at bedtime.    Yes [provider]  Epinastine HCl 0.05 % ophthalmic solution Place 1 drop into both eyes 2 (two) times daily as needed (allergic conjunctivitis). 11/16/12  Yes Jodelle GrossLawrence, Kathryn M, NP  iron polysaccharides (NIFEREX) 150 MG capsule Take 150 mg by mouth daily.    Yes [provider]  levETIRAcetam (KEPPRA) 500 MG tablet Take 500 mg by mouth 2 (two) times daily.     Yes [provider]  levothyroxine (SYNTHROID, LEVOTHROID) 75 MCG tablet Take 75 mcg by mouth every morning.    Yes [provider]  meclizine (ANTIVERT) 25 MG tablet Take 12.5 mg by mouth 3 (three) times daily as needed for dizziness.    Yes [provider]  omeprazole (PRILOSEC) 20 MG capsule Take 20 mg by mouth 2 (two) times daily before a meal.  Yes [provider]  Polyethyl Glycol-Propyl Glycol (SYSTANE ULTRA) 0.4-0.3 % SOLN Place 1 drop into both eyes daily.   Yes [provider]  potassium chloride (K-DUR,KLOR-CON) 10 MEQ tablet Take 10 mEq by mouth daily.    Yes [provider]  QC HYDROGEN PEROXIDE EX Place 2 drops into the left ear 2 (two) times daily.   Yes [provider]  senna-docusate (SENOKOT-S) 8.6-50 MG tablet Take 1 tablet by mouth at bedtime.   Yes [provider]  traMADol (ULTRAM) 50 MG tablet Take 50 mg by mouth 3 (three) times daily.   Yes  [provider]  levofloxacin (LEVAQUIN) 25 MG/ML solution Take 20 mLs (500 mg total) by mouth daily. Take Rx for 10 days 02/02/17   Donnetta Hutching, MD    Family History History reviewed. No pertinent family history.  Social History Social History  Substance Use Topics  . Smoking status: Never Smoker  . Smokeless tobacco: Never Used     Comment: tobacco use - no  . Alcohol use No     Allergies   Patient has no known allergies.   Review of Systems Review of Systems  Unable to perform ROS: Dementia  All other systems reviewed and are negative.    Physical Exam Updated Vital Signs BP (!) 120/54 (BP Location: Right Arm)   Pulse 60   Temp (!) 97.5 F (36.4 C) (Oral)   Resp 18   SpO2 100%   Physical Exam  Constitutional:  Family reports normal behavior and appearance; no dyspnea or tachypnea  HENT:  Head: Normocephalic and atraumatic.  Eyes: Conjunctivae are normal.  Neck: Neck supple.  Cardiovascular: Normal rate and regular rhythm.   Pulmonary/Chest: Effort normal and breath sounds normal.  Abdominal: Soft. Bowel sounds are normal.  Musculoskeletal: Normal range of motion.  Neurological: She is alert.  Skin: Skin is warm and dry.  Psychiatric: She has a normal mood and affect.  Nursing note and vitals reviewed.    ED Treatments / Results  Labs (all labs ordered are listed, but only abnormal results are displayed) Labs Reviewed  BASIC METABOLIC PANEL - Abnormal; Notable for the following:       Result Value   Sodium 134 (*)    BUN 24 (*)    Creatinine, Ser 1.04 (*)    Calcium 8.8 (*)    GFR calc non Af Amer 47 (*)    GFR calc Af Amer 55 (*)    All other components within normal limits  CBC WITH DIFFERENTIAL/PLATELET  TROPONIN I    EKG  EKG Interpretation  Date/Time:  Tuesday February 02 2017 14:55:30 EDT Ventricular Rate:  55 PR Interval:    QRS Duration: 104 QT Interval:  508 QTC Calculation: 486 R Axis:   -49 Text Interpretation:   Sinus rhythm Left axis deviation Borderline prolonged QT interval Confirmed by Donnetta Hutching ([id number]) on 02/02/2017 3:16:30 PM       Radiology Dg Chest 2 View  Result Date: 02/02/2017 CLINICAL DATA:  Intermittent right-sided chest pain for the past 2 days. Symptoms are worsening. History of atrial fibrillation. Nonsmoker. EXAM: CHEST  2 VIEW COMPARISON:  Chest x-ray of Nov 16, 2012 FINDINGS: The lungs are adequately inflated. The interstitial markings appear increased greatest at the left lung base. There is no discrete infiltrate. There is no pleural effusion. The heart is top-normal in size but stable. The pulmonary vascularity is not engorged. There is calcification in the wall of the aortic arch.  There is a stable sclerotic focus in the left humeral head. There is moderate dextrocurvature centered in the lower thoracic spine which is stable. IMPRESSION: Chronic bronchitic changes, stable. Superimposed interstitial pneumonia is suspected at the left lung base. No pneumothorax or CHF. Thoracic aortic atherosclerosis. Electronically Signed   By: David  [place] M.D.   On: 02/02/2017 15:47    Procedures Procedures (including critical care time)  Medications Ordered in ED Medications  levofloxacin (LEVAQUIN) 25 MG/ML solution 500 mg (not administered)     Initial Impression / Assessment and Plan / ED Course  I have reviewed the triage vital signs and the nursing notes.  Pertinent labs & imaging results that were available during my care of the patient were reviewed by me and considered in my medical decision making (see chart for details).     Patient appears in no acute distress. EKG shows no acute changes.Troponin negative.  Chest x-ray suggests a pneumonia in the left lung base.  Patient is oxygenating well. She can be treated in the nursing facility as an out pt.  Will rx Levaquin  Final Clinical Impressions(s) / ED Diagnoses   Final diagnoses:  Chest pain, unspecified type  HCAP  (healthcare-associated pneumonia)    New Prescriptions New Prescriptions   LEVOFLOXACIN (LEVAQUIN) 25 MG/ML SOLUTION    Take 20 mLs (500 mg total) by mouth daily. Take Rx for 10 days     Donnetta Hutching, MD 02/02/17 [phone]

## 2017-02-02 NOTE — ED Notes (Signed)
Per AC. No sus levaquin. edp aware

## 2017-02-04 DIAGNOSIS — R2681 Unsteadiness on feet: Secondary | ICD-10-CM | POA: Diagnosis not present

## 2017-02-04 DIAGNOSIS — I482 Chronic atrial fibrillation: Secondary | ICD-10-CM | POA: Diagnosis not present

## 2017-02-04 DIAGNOSIS — M6281 Muscle weakness (generalized): Secondary | ICD-10-CM | POA: Diagnosis not present

## 2017-02-04 DIAGNOSIS — F0390 Unspecified dementia without behavioral disturbance: Secondary | ICD-10-CM | POA: Diagnosis not present

## 2017-02-09 DIAGNOSIS — M6281 Muscle weakness (generalized): Secondary | ICD-10-CM | POA: Diagnosis not present

## 2017-02-09 DIAGNOSIS — R2681 Unsteadiness on feet: Secondary | ICD-10-CM | POA: Diagnosis not present

## 2017-02-09 DIAGNOSIS — I482 Chronic atrial fibrillation: Secondary | ICD-10-CM | POA: Diagnosis not present

## 2017-02-09 DIAGNOSIS — F0390 Unspecified dementia without behavioral disturbance: Secondary | ICD-10-CM | POA: Diagnosis not present

## 2017-02-11 DIAGNOSIS — I482 Chronic atrial fibrillation: Secondary | ICD-10-CM | POA: Diagnosis not present

## 2017-02-11 DIAGNOSIS — F0390 Unspecified dementia without behavioral disturbance: Secondary | ICD-10-CM | POA: Diagnosis not present

## 2017-02-11 DIAGNOSIS — R2681 Unsteadiness on feet: Secondary | ICD-10-CM | POA: Diagnosis not present

## 2017-02-11 DIAGNOSIS — M6281 Muscle weakness (generalized): Secondary | ICD-10-CM | POA: Diagnosis not present

## 2017-02-16 DIAGNOSIS — M6281 Muscle weakness (generalized): Secondary | ICD-10-CM | POA: Diagnosis not present

## 2017-02-16 DIAGNOSIS — I482 Chronic atrial fibrillation: Secondary | ICD-10-CM | POA: Diagnosis not present

## 2017-02-16 DIAGNOSIS — F0390 Unspecified dementia without behavioral disturbance: Secondary | ICD-10-CM | POA: Diagnosis not present

## 2017-02-16 DIAGNOSIS — R2681 Unsteadiness on feet: Secondary | ICD-10-CM | POA: Diagnosis not present

## 2017-02-18 DIAGNOSIS — M6281 Muscle weakness (generalized): Secondary | ICD-10-CM | POA: Diagnosis not present

## 2017-02-18 DIAGNOSIS — R2681 Unsteadiness on feet: Secondary | ICD-10-CM | POA: Diagnosis not present

## 2017-02-18 DIAGNOSIS — I482 Chronic atrial fibrillation: Secondary | ICD-10-CM | POA: Diagnosis not present

## 2017-02-18 DIAGNOSIS — F0390 Unspecified dementia without behavioral disturbance: Secondary | ICD-10-CM | POA: Diagnosis not present

## 2017-02-23 DIAGNOSIS — F0390 Unspecified dementia without behavioral disturbance: Secondary | ICD-10-CM | POA: Diagnosis not present

## 2017-02-23 DIAGNOSIS — I482 Chronic atrial fibrillation: Secondary | ICD-10-CM | POA: Diagnosis not present

## 2017-02-23 DIAGNOSIS — R2681 Unsteadiness on feet: Secondary | ICD-10-CM | POA: Diagnosis not present

## 2017-02-23 DIAGNOSIS — M6281 Muscle weakness (generalized): Secondary | ICD-10-CM | POA: Diagnosis not present

## 2017-02-25 DIAGNOSIS — I482 Chronic atrial fibrillation: Secondary | ICD-10-CM | POA: Diagnosis not present

## 2017-02-25 DIAGNOSIS — R2681 Unsteadiness on feet: Secondary | ICD-10-CM | POA: Diagnosis not present

## 2017-02-25 DIAGNOSIS — M6281 Muscle weakness (generalized): Secondary | ICD-10-CM | POA: Diagnosis not present

## 2017-02-25 DIAGNOSIS — F0390 Unspecified dementia without behavioral disturbance: Secondary | ICD-10-CM | POA: Diagnosis not present

## 2017-03-02 DIAGNOSIS — M6281 Muscle weakness (generalized): Secondary | ICD-10-CM | POA: Diagnosis not present

## 2017-03-02 DIAGNOSIS — F0390 Unspecified dementia without behavioral disturbance: Secondary | ICD-10-CM | POA: Diagnosis not present

## 2017-03-02 DIAGNOSIS — I482 Chronic atrial fibrillation: Secondary | ICD-10-CM | POA: Diagnosis not present

## 2017-03-02 DIAGNOSIS — R2681 Unsteadiness on feet: Secondary | ICD-10-CM | POA: Diagnosis not present

## 2017-03-04 DIAGNOSIS — I482 Chronic atrial fibrillation: Secondary | ICD-10-CM | POA: Diagnosis not present

## 2017-03-04 DIAGNOSIS — M6281 Muscle weakness (generalized): Secondary | ICD-10-CM | POA: Diagnosis not present

## 2017-03-04 DIAGNOSIS — F0390 Unspecified dementia without behavioral disturbance: Secondary | ICD-10-CM | POA: Diagnosis not present

## 2017-03-04 DIAGNOSIS — R2681 Unsteadiness on feet: Secondary | ICD-10-CM | POA: Diagnosis not present

## 2017-03-09 DIAGNOSIS — R2681 Unsteadiness on feet: Secondary | ICD-10-CM | POA: Diagnosis not present

## 2017-03-09 DIAGNOSIS — I482 Chronic atrial fibrillation: Secondary | ICD-10-CM | POA: Diagnosis not present

## 2017-03-09 DIAGNOSIS — M6281 Muscle weakness (generalized): Secondary | ICD-10-CM | POA: Diagnosis not present

## 2017-03-09 DIAGNOSIS — F0390 Unspecified dementia without behavioral disturbance: Secondary | ICD-10-CM | POA: Diagnosis not present

## 2017-03-11 DIAGNOSIS — M6281 Muscle weakness (generalized): Secondary | ICD-10-CM | POA: Diagnosis not present

## 2017-03-11 DIAGNOSIS — I482 Chronic atrial fibrillation: Secondary | ICD-10-CM | POA: Diagnosis not present

## 2017-03-11 DIAGNOSIS — F0390 Unspecified dementia without behavioral disturbance: Secondary | ICD-10-CM | POA: Diagnosis not present

## 2017-03-11 DIAGNOSIS — R2681 Unsteadiness on feet: Secondary | ICD-10-CM | POA: Diagnosis not present

## 2017-03-16 DIAGNOSIS — M6281 Muscle weakness (generalized): Secondary | ICD-10-CM | POA: Diagnosis not present

## 2017-03-16 DIAGNOSIS — I482 Chronic atrial fibrillation: Secondary | ICD-10-CM | POA: Diagnosis not present

## 2017-03-16 DIAGNOSIS — R2681 Unsteadiness on feet: Secondary | ICD-10-CM | POA: Diagnosis not present

## 2017-03-16 DIAGNOSIS — F0390 Unspecified dementia without behavioral disturbance: Secondary | ICD-10-CM | POA: Diagnosis not present

## 2017-03-18 DIAGNOSIS — F0390 Unspecified dementia without behavioral disturbance: Secondary | ICD-10-CM | POA: Diagnosis not present

## 2017-03-18 DIAGNOSIS — I482 Chronic atrial fibrillation: Secondary | ICD-10-CM | POA: Diagnosis not present

## 2017-03-18 DIAGNOSIS — R2681 Unsteadiness on feet: Secondary | ICD-10-CM | POA: Diagnosis not present

## 2017-03-18 DIAGNOSIS — M6281 Muscle weakness (generalized): Secondary | ICD-10-CM | POA: Diagnosis not present

## 2017-03-23 DIAGNOSIS — I482 Chronic atrial fibrillation: Secondary | ICD-10-CM | POA: Diagnosis not present

## 2017-03-23 DIAGNOSIS — R2681 Unsteadiness on feet: Secondary | ICD-10-CM | POA: Diagnosis not present

## 2017-03-23 DIAGNOSIS — M6281 Muscle weakness (generalized): Secondary | ICD-10-CM | POA: Diagnosis not present

## 2017-03-23 DIAGNOSIS — F0390 Unspecified dementia without behavioral disturbance: Secondary | ICD-10-CM | POA: Diagnosis not present

## 2017-03-25 DIAGNOSIS — I482 Chronic atrial fibrillation: Secondary | ICD-10-CM | POA: Diagnosis not present

## 2017-03-25 DIAGNOSIS — M6281 Muscle weakness (generalized): Secondary | ICD-10-CM | POA: Diagnosis not present

## 2017-03-25 DIAGNOSIS — F0390 Unspecified dementia without behavioral disturbance: Secondary | ICD-10-CM | POA: Diagnosis not present

## 2017-03-25 DIAGNOSIS — R2681 Unsteadiness on feet: Secondary | ICD-10-CM | POA: Diagnosis not present

## 2017-04-16 DIAGNOSIS — Z23 Encounter for immunization: Secondary | ICD-10-CM | POA: Diagnosis not present

## 2017-04-19 DIAGNOSIS — R2681 Unsteadiness on feet: Secondary | ICD-10-CM | POA: Diagnosis not present

## 2017-04-19 DIAGNOSIS — I482 Chronic atrial fibrillation: Secondary | ICD-10-CM | POA: Diagnosis not present

## 2017-04-19 DIAGNOSIS — F0390 Unspecified dementia without behavioral disturbance: Secondary | ICD-10-CM | POA: Diagnosis not present

## 2017-04-19 DIAGNOSIS — M6281 Muscle weakness (generalized): Secondary | ICD-10-CM | POA: Diagnosis not present

## 2017-05-06 DIAGNOSIS — H353131 Nonexudative age-related macular degeneration, bilateral, early dry stage: Secondary | ICD-10-CM | POA: Diagnosis not present

## 2017-05-10 ENCOUNTER — Other Ambulatory Visit: Payer: Self-pay

## 2017-05-10 ENCOUNTER — Emergency Department (HOSPITAL_COMMUNITY): Payer: Medicare Other

## 2017-05-10 ENCOUNTER — Emergency Department (HOSPITAL_COMMUNITY)
Admission: EM | Admit: 2017-05-10 | Discharge: 2017-05-10 | Disposition: A | Discharge status: Home / Self Care | Payer: Medicare Other | Attending: Emergency Medicine | Admitting: Emergency Medicine

## 2017-05-10 ENCOUNTER — Encounter (HOSPITAL_COMMUNITY): Payer: Self-pay | Admitting: Emergency Medicine

## 2017-05-10 DIAGNOSIS — Y999 Unspecified external cause status: Secondary | ICD-10-CM | POA: Diagnosis not present

## 2017-05-10 DIAGNOSIS — S8012XA Contusion of left lower leg, initial encounter: Secondary | ICD-10-CM | POA: Insufficient documentation

## 2017-05-10 DIAGNOSIS — G309 Alzheimer's disease, unspecified: Secondary | ICD-10-CM | POA: Insufficient documentation

## 2017-05-10 DIAGNOSIS — S3993XA Unspecified injury of pelvis, initial encounter: Secondary | ICD-10-CM | POA: Diagnosis not present

## 2017-05-10 DIAGNOSIS — N3000 Acute cystitis without hematuria: Secondary | ICD-10-CM | POA: Insufficient documentation

## 2017-05-10 DIAGNOSIS — Y929 Unspecified place or not applicable: Secondary | ICD-10-CM | POA: Diagnosis not present

## 2017-05-10 DIAGNOSIS — R2240 Localized swelling, mass and lump, unspecified lower limb: Secondary | ICD-10-CM | POA: Diagnosis not present

## 2017-05-10 DIAGNOSIS — M7989 Other specified soft tissue disorders: Secondary | ICD-10-CM | POA: Diagnosis not present

## 2017-05-10 DIAGNOSIS — S8991XA Unspecified injury of right lower leg, initial encounter: Secondary | ICD-10-CM | POA: Diagnosis not present

## 2017-05-10 DIAGNOSIS — R58 Hemorrhage, not elsewhere classified: Secondary | ICD-10-CM | POA: Insufficient documentation

## 2017-05-10 DIAGNOSIS — S79911A Unspecified injury of right hip, initial encounter: Secondary | ICD-10-CM | POA: Diagnosis present

## 2017-05-10 DIAGNOSIS — S99922A Unspecified injury of left foot, initial encounter: Secondary | ICD-10-CM | POA: Diagnosis not present

## 2017-05-10 DIAGNOSIS — R233 Spontaneous ecchymoses: Secondary | ICD-10-CM | POA: Diagnosis not present

## 2017-05-10 DIAGNOSIS — R531 Weakness: Secondary | ICD-10-CM | POA: Diagnosis not present

## 2017-05-10 DIAGNOSIS — M79651 Pain in right thigh: Secondary | ICD-10-CM | POA: Diagnosis not present

## 2017-05-10 DIAGNOSIS — S99912A Unspecified injury of left ankle, initial encounter: Secondary | ICD-10-CM | POA: Diagnosis not present

## 2017-05-10 DIAGNOSIS — S7001XA Contusion of right hip, initial encounter: Secondary | ICD-10-CM | POA: Diagnosis not present

## 2017-05-10 DIAGNOSIS — Y939 Activity, unspecified: Secondary | ICD-10-CM | POA: Diagnosis not present

## 2017-05-10 DIAGNOSIS — S8992XA Unspecified injury of left lower leg, initial encounter: Secondary | ICD-10-CM | POA: Diagnosis not present

## 2017-05-10 DIAGNOSIS — M79605 Pain in left leg: Secondary | ICD-10-CM | POA: Diagnosis not present

## 2017-05-10 DIAGNOSIS — W19XXXA Unspecified fall, initial encounter: Secondary | ICD-10-CM | POA: Diagnosis not present

## 2017-05-10 LAB — CBC WITH DIFFERENTIAL/PLATELET
BASOS ABS: 0 10*3/uL (ref 0.0–0.1)
Basophils Relative: 0 %
EOS PCT: 0 %
Eosinophils Absolute: 0 10*3/uL (ref 0.0–0.7)
HCT: 33.6 % — ABNORMAL LOW (ref 36.0–46.0)
Hemoglobin: 11 g/dL — ABNORMAL LOW (ref 12.0–15.0)
LYMPHS ABS: 1 10*3/uL (ref 0.7–4.0)
LYMPHS PCT: 12 %
MCH: 30.1 pg (ref 26.0–34.0)
MCHC: 32.7 g/dL (ref 30.0–36.0)
MCV: 91.8 fL (ref 78.0–100.0)
MONO ABS: 1.4 10*3/uL — AB (ref 0.1–1.0)
Monocytes Relative: 16 %
Neutro Abs: 6.1 10*3/uL (ref 1.7–7.7)
Neutrophils Relative %: 72 %
PLATELETS: 141 10*3/uL — AB (ref 150–400)
RBC: 3.66 MIL/uL — ABNORMAL LOW (ref 3.87–5.11)
RDW: 14.1 % (ref 11.5–15.5)
WBC: 8.6 10*3/uL (ref 4.0–10.5)

## 2017-05-10 LAB — COMPREHENSIVE METABOLIC PANEL
ALT: 14 U/L (ref 14–54)
ANION GAP: 8 (ref 5–15)
AST: 20 U/L (ref 15–41)
Albumin: 3.8 g/dL (ref 3.5–5.0)
Alkaline Phosphatase: 54 U/L (ref 38–126)
BUN: 30 mg/dL — AB (ref 6–20)
CHLORIDE: 103 mmol/L (ref 101–111)
CO2: 24 mmol/L (ref 22–32)
CREATININE: 1.36 mg/dL — AB (ref 0.44–1.00)
Calcium: 8.9 mg/dL (ref 8.9–10.3)
GFR, EST AFRICAN AMERICAN: 39 mL/min — AB (ref >60)
GFR, EST NON AFRICAN AMERICAN: 34 mL/min — AB (ref >60)
Glucose, Bld: 87 mg/dL (ref 65–99)
POTASSIUM: 4.6 mmol/L (ref 3.5–5.1)
Sodium: 135 mmol/L (ref 135–145)
Total Bilirubin: 1.1 mg/dL (ref 0.3–1.2)
Total Protein: 5.8 g/dL — ABNORMAL LOW (ref 6.5–8.1)

## 2017-05-10 LAB — URINALYSIS, ROUTINE W REFLEX MICROSCOPIC
BILIRUBIN URINE: NEGATIVE
Glucose, UA: NEGATIVE mg/dL
Hgb urine dipstick: NEGATIVE
Ketones, ur: NEGATIVE mg/dL
Nitrite: NEGATIVE
Protein, ur: NEGATIVE mg/dL
SPECIFIC GRAVITY, URINE: 1.015 (ref 1.005–1.030)
pH: 6 (ref 5.0–8.0)

## 2017-05-10 MED ORDER — ACETAMINOPHEN 325 MG PO TABS: 650.0000 mg | ORAL_TABLET | Freq: Four times a day (QID) | ORAL | Status: DC | PRN

## 2017-05-10 MED ORDER — ACETAMINOPHEN 325 MG PO TABS
650.0000 mg | ORAL_TABLET | Freq: Four times a day (QID) | ORAL | Status: DC | PRN
  Administered 2017-05-10: 650 mg via ORAL
  Filled 2017-05-10: qty 2

## 2017-05-10 MED ORDER — CEPHALEXIN 500 MG PO CAPS
500.0000 mg | ORAL_CAPSULE | Freq: Once | ORAL | Status: AC
  Administered 2017-05-10: 500 mg via ORAL
  Filled 2017-05-10: qty 1

## 2017-05-10 MED ORDER — SODIUM CHLORIDE 0.9 % IV BOLUS (SEPSIS)
500.0000 mL | Freq: Once | INTRAVENOUS | Status: AC
  Administered 2017-05-10: 500 mL via INTRAVENOUS

## 2017-05-10 MED ORDER — SODIUM CHLORIDE 0.9 % IV BOLUS (SEPSIS)
1000.0000 mL | Freq: Once | INTRAVENOUS | Status: AC
  Administered 2017-05-10: 1000 mL via INTRAVENOUS

## 2017-05-10 MED ORDER — CEPHALEXIN 500 MG PO CAPS: 500.0000 mg | ORAL_CAPSULE | Freq: Four times a day (QID) | ORAL | 0 refills | Status: DC

## 2017-05-10 MED ORDER — FENTANYL CITRATE (PF) 100 MCG/2ML IJ SOLN
50.0000 ug | Freq: Once | INTRAMUSCULAR | Status: AC
  Administered 2017-05-10: 50 ug via INTRAVENOUS
  Filled 2017-05-10: qty 2

## 2017-05-10 NOTE — ED Notes (Signed)
[place] at facility with discharge instructions and RX orders, [facility] given

## 2017-05-10 NOTE — ED Triage Notes (Signed)
Pt fell last night but refused tocome in. Pt alert/oriented to some. Swelling and bruising noted to ble. Denies pain to upper body. No markings noted to upper body. ruising noted to right hip and thigh also. Pedal pulses present but weaker on left

## 2017-05-10 NOTE — ED Notes (Signed)
Checked with pt for urine sample,pt doesn't have to go right now.Will try back in 30 mins

## 2017-05-10 NOTE — ED Notes (Signed)
Discharge instructions and paperwork given to family, requested a call to facility at [phone] to discuss RX and discharge instructions

## 2017-05-10 NOTE — ED Notes (Signed)
Mild shortening noted to left leg. Pain with touch to left foot.

## 2017-05-10 NOTE — ED Notes (Signed)
Pt taken to xray 

## 2017-05-10 NOTE — ED Provider Notes (Signed)
Emergency Department Provider Note   I have reviewed the triage vital signs and the nursing notes.   HISTORY  Chief Complaint Fall   HPI [name] [name] is a 83 y.o. female with multiple medical problems as diagnosed below the presents to the emergency department with what sounds like an unwitnessed fall.  She fell last night and was able to ablate without difficulty afterwards however this morning had significant bruising to the left lower extremity with worsening pain in that area also with bruising to her right hip and worsening pain in that area so came here for evaluation.  She does not remember the fall and her caregiver was not there when it happened.  She has no complaints of pain in her arms, chest, back, neck, spine, abdomen or the rest of her lower extremities.  Past Medical History:  Diagnosis Date  . Alzheimer's dementia   . Anemia    Gastritis/duodenitis  . Atrial fibrillation (HCC)   . Diverticulosis   . Hx of colonic polyps 2010   2010: Colonoscopy and polypectomy; diverticula also noted  . Hyperlipidemia   . Mental retardation   . Osteoarthritis   . Pneumonia 01/1999 and  . Seizure disorder [facility])     Patient Active Problem List   Diagnosis Date Noted  . Thrombocytopenia (HCC) 11/16/2012  . Conjunctivitis, allergic 11/16/2012  . Laboratory test 07/14/2012  . Hyperlipidemia 01/22/2011  . Anemia, normocytic normochromic-resolved 02/05/2009  . Atrial fibrillation (HCC) 02/05/2009  . SEIZURE DISORDER 02/05/2009    Past Surgical History:  Procedure Laterality Date  . CHOLECYSTECTOMY    . COLONOSCOPY W/ POLYPECTOMY  01/18/2009    simple adenomas/moderate diverticulosis, torturous colon, normal TI, vascular prominence near dentate line.  . ESOPHAGOGASTRODUODENOSCOPY  01/18/2009   Severe chronic gastritis, peptic duodenitis  . HEMORRHOID SURGERY      Current Outpatient Rx  . Order #: [id number] Class: Normal  . Order #: [id number] Class: Historical Med  .  Order #: [id number] Class: Historical Med  . Order #: [id number] Class: Historical Med  . Order #: [id number] Class: Historical Med  . Order #: [id number] Class: Historical Med  . Order #: [id number] Class: Historical Med  . Order #: [id number] Class: Historical Med  . Order #: [id number] Class: Historical Med  . Order #: [id number] Class: Historical Med  . Order #: [id number] Class: Historical Med  . Order #: [id number] Class: Historical Med  . Order #: [id number] Class: Historical Med  . Order #: [id number] Class: Historical Med  . Order #: [id number] Class: Historical Med  . Order #: [id number] Class: Historical Med  . Order #: [id number] Class: Historical Med  . Order #: [id number] Class: Historical Med  . Order #: [id number] Class: Print  . Order #: [id number] Class: Print    Allergies Patient has no known allergies.  History reviewed. No pertinent family history.  Social History Social History   Tobacco Use  . Smoking status: Never Smoker  . Smokeless tobacco: Never Used  . Tobacco comment: tobacco use - no  Substance Use Topics  . Alcohol use: No  . Drug use: No    Review of Systems  All other systems negative except as documented in the HPI. All pertinent positives and negatives as reviewed in the HPI. ____________________________________________   PHYSICAL EXAM:  VITAL SIGNS: ED Triage Vitals  Enc Vitals Group     BP 05/10/17 1000 (!) 95/42     Pulse Rate 05/10/17 0954 72     Resp 05/10/17 0954 13     Temp 05/10/17 0954 97.6 F (36.4 C)  Temp Source 05/10/17 0954 Oral     SpO2 05/10/17 0954 98 %     Weight 05/10/17 1035 100 lb (45.4 kg)     Height 05/10/17 1035 5' (1.524 m)    Constitutional: Alert and oriented. Well appearing and in no acute distress. Eyes: Conjunctivae are normal. PERRL. EOMI. Head: Atraumatic. Nose: No congestion/rhinnorhea. Mouth/Throat: Mucous membranes are moist.  Oropharynx non-erythematous. Neck: No stridor.  No meningeal signs.   Cardiovascular: Normal rate,  regular rhythm. Good peripheral circulation. Grossly normal heart sounds.   Respiratory: Normal respiratory effort.  No retractions. Lungs CTAB. Gastrointestinal: Soft and nontender. No distention.  Musculoskeletal: No lower extremity tenderness nor edema but has marked left tib/fib ecchymosis and ttp on left. Has large ecchymosis of right hip and pelvis which is ttp. Curved spine without stepoffs or deformities. No gross deformities of extremities. Neurologic:  Normal speech and language. No gross focal neurologic deficits are appreciated.  Skin:  Skin is warm, dry and intact. No rash noted.   ____________________________________________   LABS (all labs ordered are listed, but only abnormal results are displayed)  Labs Reviewed  CBC WITH DIFFERENTIAL/PLATELET - Abnormal; Notable for the following components:      Result Value   RBC 3.66 (*)    Hemoglobin 11.0 (*)    HCT 33.6 (*)    Platelets 141 (*)    Monocytes Absolute 1.4 (*)    All other components within normal limits  COMPREHENSIVE METABOLIC PANEL - Abnormal; Notable for the following components:   BUN 30 (*)    Creatinine, Ser 1.36 (*)    Total Protein 5.8 (*)    GFR calc non Af Amer 34 (*)    GFR calc Af Amer 39 (*)    All other components within normal limits  URINALYSIS, ROUTINE W REFLEX MICROSCOPIC - Abnormal; Notable for the following components:   APPearance HAZY (*)    Leukocytes, UA MODERATE (*)    Bacteria, UA RARE (*)    Squamous Epithelial / LPF 0-5 (*)    All other components within normal limits  URINE CULTURE   ____________________________________________  EKG   EKG Interpretation  Date/Time:  Monday May 10 2017 12:39:58 EST Ventricular Rate:  76 PR Interval:    QRS Duration: 124 QT Interval:  435 QTC Calculation: 490 R Axis:   -90 Text Interpretation:  Sinus rhythm Nonspecific IVCD with LAD Anterolateral infarct, age indeterminate Confirmed by Marily MemosMesner, Rage Beever [phone]) on 05/10/2017 1:13:42  PM      ____________________________________________  RADIOLOGY  Dg Pelvis 1-2 Views  Result Date: 05/10/2017 CLINICAL DATA:  Fall last night, right hip bruising. EXAM: PELVIS - 1-2 VIEW COMPARISON:  None. FINDINGS: No acute bony abnormality. Specifically, no fracture, subluxation, or dislocation. Soft tissues are intact. Mild diffuse osteopenia. Hip joints and SI joints are symmetric and unremarkable. IMPRESSION: No acute bony abnormality. Electronically Signed   By: Charlett NoseKevin  Dover M.D.   On: 05/10/2017 11:46   Dg Tibia/fibula Right  Result Date: 05/10/2017 CLINICAL DATA:  Fall EXAM: RIGHT TIBIA AND FIBULA - 2 VIEW COMPARISON:  None. FINDINGS: No acute bony abnormality. Specifically, no fracture, subluxation, or dislocation. Soft tissues are intact. IMPRESSION: No acute bony abnormality. Electronically Signed   By: Charlett NoseKevin  Dover M.D.   On: 05/10/2017 11:45   Dg Ankle Complete Left  Result Date: 05/10/2017 CLINICAL DATA:  Fall with swelling and bruising, initial encounter. EXAM: LEFT ANKLE COMPLETE - 3+ VIEW COMPARISON:  None. FINDINGS: Soft tissue swelling is seen about  the ankle joint. Ankle mortise is intact. There may be mild osteopenia. No definite fracture. IMPRESSION: Soft tissue swelling without definite fracture. Electronically Signed   By: Leanna Battles M.D.   On: 05/10/2017 11:48   Ct Pelvis Wo Contrast  Result Date: 05/10/2017 CLINICAL DATA:  Patient fell last night with bruising of the left leg. Pain with movement. EXAM: CT PELVIS WITHOUT CONTRAST TECHNIQUE: Multidetector CT imaging of the pelvis was performed following the standard protocol without intravenous contrast. COMPARISON:  Radiographs of the pelvis on the same day. FINDINGS: Urinary Tract: No distal ureteral calculi nor hydroureter. Physiologic distention of the urinary bladder without focal mural thickening. Small right posterolateral urinary bladder diverticula, one containing tiny layering calculi. Bowel: Moderate  fecal retention within the included colon with sigmoid diverticulosis. No bowel obstruction or acute inflammation. Vascular/Lymphatic: Mild aortoiliac atherosclerosis.  No aneurysm. Reproductive:  No mass or other significant abnormality Other: Soft tissue induration and bruising noted over the right buttock and gluteal muscles with small subcutaneous hematomas. Musculoskeletal: Levorotatory scoliosis of the included lower lumbar spine with associated degenerative disc and facet arthropathy. No acute appearing pelvic fracture or suspicious osseous lesions. Generalized osteopenia of the pelvis and hips. Joint space narrowing of both hips without fracture nor joint effusion. IMPRESSION: 1. Soft tissue swelling with induration and subcutaneous hematomas overlying the right gluteal muscles of the right buttock. 2. No acute pelvic or hip fracture. 3. Levorotatory scoliosis of the included lumbar spine with facet arthropathy and degenerative disc disease. 4. Colonic diverticulosis.  No bowel inflammation. 5. Right posterolateral bladder diverticula, one which contains tiny layering calculi. Electronically Signed   By: Tollie Eth M.D.   On: 05/10/2017 14:20   Ct Tibia Fibula Left Wo Contrast  Result Date: 05/10/2017 CLINICAL DATA:  Left lower leg pain and bruising since a fall last night. EXAM: CT OF THE LOWER LEFT EXTREMITY WITHOUT CONTRAST TECHNIQUE: Multidetector CT imaging of the lower left extremity was performed according to the standard protocol. COMPARISON:  Plain films left foot and ankle this same day. FINDINGS: Bones/Joint/Cartilage Bones are osteopenic.  No fracture dislocation. Ligaments Suboptimally assessed by CT. Muscles and Tendons Intact. Soft tissues Subcutaneous edema is present diffusely. Foci of increased attenuation in subcutaneous tissues about the distal aspect of the lower leg consistent with soft tissue contusions and/or small hematomas. IMPRESSION: Soft tissue contusions/small hematomas  left lower leg without fracture or other acute abnormality. Osteopenia. Electronically Signed   By: Drusilla Kanner M.D.   On: 05/10/2017 14:16   Dg Foot Complete Left  Result Date: 05/10/2017 CLINICAL DATA:  Fall.  Bruising in left ankle and foot. EXAM: LEFT FOOT - COMPLETE 3+ VIEW COMPARISON:  None. FINDINGS: Mild degenerative changes and hallux valgus at the first MTP joint. Degenerative changes in the midfoot. No acute bony abnormality. Specifically, no fracture, subluxation, or dislocation. Soft tissues are intact. IMPRESSION: No acute bony abnormality. Electronically Signed   By: Charlett Nose M.D.   On: 05/10/2017 11:48   Dg Femur, Min 2 Views Right  Result Date: 05/10/2017 CLINICAL DATA:  Fall, right hip and thigh bruising. EXAM: RIGHT FEMUR 2 VIEWS COMPARISON:  None. FINDINGS: Soft tissue swelling noted over the proximal lateral femur/ greater trochanter region. No fracture, subluxation or dislocation. Joint spaces maintained. IMPRESSION: No acute bony abnormality. Electronically Signed   By: Charlett Nose M.D.   On: 05/10/2017 11:46    ____________________________________________   PROCEDURES  Procedure(s) performed:   Procedures   ____________________________________________   INITIAL IMPRESSION /  ASSESSMENT AND PLAN / ED COURSE  Pertinent labs & imaging results that were available during my care of the patient were reviewed by me and considered in my medical decision making (see chart for details).  XR's ok. Initially walked on it but with the level of bruising and swelling will ct affected parts and then attempt ambulation if negative.   Ambulated normally. No obvious fractures on any imaging. Will dc with PT at facilities.  ____________________________________________  FINAL CLINICAL IMPRESSION(S) / ED DIAGNOSES  Final diagnoses:  Fall, initial encounter  Ecchymosis  Acute cystitis without hematuria    MEDICATIONS GIVEN DURING THIS VISIT:  Medications    acetaminophen (TYLENOL) tablet 650 mg (650 mg Oral Given 05/10/17 1901)  acetaminophen (TYLENOL) tablet 650 mg (not administered)  fentaNYL (SUBLIMAZE) injection 50 mcg (50 mcg Intravenous Given 05/10/17 1253)  sodium chloride 0.9 % bolus 500 mL (0 mLs Intravenous Stopped 05/10/17 1344)  sodium chloride 0.9 % bolus 1,000 mL (0 mLs Intravenous Stopped 05/10/17 1846)  cephALEXin (KEFLEX) capsule 500 mg (500 mg Oral Given 05/10/17 1901)     NEW OUTPATIENT MEDICATIONS STARTED DURING THIS VISIT:  This SmartLink is deprecated. Use AVSMEDLIST instead to display the medication list for a patient.  Note:  This document was prepared using Dragon voice recognition software and may include unintentional dictation errors.   Marily MemosMesner, Breslyn Abdo, MD 05/10/17 2209

## 2017-05-12 LAB — URINE CULTURE: Culture: NO GROWTH

## 2017-05-19 ENCOUNTER — Emergency Department (HOSPITAL_COMMUNITY): Payer: Medicare Other

## 2017-05-19 ENCOUNTER — Encounter (HOSPITAL_COMMUNITY): Payer: Self-pay | Admitting: *Deleted

## 2017-05-19 ENCOUNTER — Other Ambulatory Visit: Payer: Self-pay

## 2017-05-19 ENCOUNTER — Emergency Department (HOSPITAL_COMMUNITY)
Admission: EM | Admit: 2017-05-19 | Discharge: 2017-05-19 | Disposition: A | Discharge status: Other Acute Inpatient Hospital | Payer: Medicare Other | Attending: Emergency Medicine | Admitting: Emergency Medicine

## 2017-05-19 DIAGNOSIS — Z79899 Other long term (current) drug therapy: Secondary | ICD-10-CM | POA: Insufficient documentation

## 2017-05-19 DIAGNOSIS — D649 Anemia, unspecified: Secondary | ICD-10-CM | POA: Insufficient documentation

## 2017-05-19 DIAGNOSIS — Y939 Activity, unspecified: Secondary | ICD-10-CM | POA: Insufficient documentation

## 2017-05-19 DIAGNOSIS — Y999 Unspecified external cause status: Secondary | ICD-10-CM | POA: Diagnosis not present

## 2017-05-19 DIAGNOSIS — E039 Hypothyroidism, unspecified: Secondary | ICD-10-CM | POA: Insufficient documentation

## 2017-05-19 DIAGNOSIS — R2243 Localized swelling, mass and lump, lower limb, bilateral: Secondary | ICD-10-CM | POA: Diagnosis present

## 2017-05-19 DIAGNOSIS — S8011XA Contusion of right lower leg, initial encounter: Secondary | ICD-10-CM | POA: Insufficient documentation

## 2017-05-19 DIAGNOSIS — I10 Essential (primary) hypertension: Secondary | ICD-10-CM | POA: Insufficient documentation

## 2017-05-19 DIAGNOSIS — Y929 Unspecified place or not applicable: Secondary | ICD-10-CM | POA: Insufficient documentation

## 2017-05-19 DIAGNOSIS — S8012XA Contusion of left lower leg, initial encounter: Secondary | ICD-10-CM | POA: Insufficient documentation

## 2017-05-19 DIAGNOSIS — X58XXXA Exposure to other specified factors, initial encounter: Secondary | ICD-10-CM | POA: Insufficient documentation

## 2017-05-19 DIAGNOSIS — S300XXA Contusion of lower back and pelvis, initial encounter: Secondary | ICD-10-CM | POA: Diagnosis not present

## 2017-05-19 DIAGNOSIS — G309 Alzheimer's disease, unspecified: Secondary | ICD-10-CM | POA: Diagnosis not present

## 2017-05-19 DIAGNOSIS — T07XXXA Unspecified multiple injuries, initial encounter: Secondary | ICD-10-CM

## 2017-05-19 HISTORY — DX: Gastro-esophageal reflux disease without esophagitis: K21.9

## 2017-05-19 HISTORY — DX: Unspecified protein-calorie malnutrition: E46

## 2017-05-19 HISTORY — DX: Hyperlipidemia, unspecified: E78.5

## 2017-05-19 HISTORY — DX: Hypothyroidism, unspecified: E03.9

## 2017-05-19 LAB — COMPREHENSIVE METABOLIC PANEL
ALT: 13 U/L — AB (ref 14–54)
AST: 19 U/L (ref 15–41)
Albumin: 3.6 g/dL (ref 3.5–5.0)
Alkaline Phosphatase: 76 U/L (ref 38–126)
Anion gap: 6 (ref 5–15)
BUN: 28 mg/dL — AB (ref 6–20)
CALCIUM: 9 mg/dL (ref 8.9–10.3)
CO2: 23 mmol/L (ref 22–32)
CREATININE: 1.03 mg/dL — AB (ref 0.44–1.00)
Chloride: 105 mmol/L (ref 101–111)
GFR calc Af Amer: 55 mL/min — ABNORMAL LOW (ref >60)
GFR, EST NON AFRICAN AMERICAN: 47 mL/min — AB (ref >60)
Glucose, Bld: 81 mg/dL (ref 65–99)
Potassium: 4.5 mmol/L (ref 3.5–5.1)
Sodium: 134 mmol/L — ABNORMAL LOW (ref 135–145)
TOTAL PROTEIN: 5.9 g/dL — AB (ref 6.5–8.1)
Total Bilirubin: 1.2 mg/dL (ref 0.3–1.2)

## 2017-05-19 LAB — CBC WITH DIFFERENTIAL/PLATELET
BASOS ABS: 0 10*3/uL (ref 0.0–0.1)
Basophils Relative: 0 %
Eosinophils Absolute: 0.1 10*3/uL (ref 0.0–0.7)
Eosinophils Relative: 1 %
HEMATOCRIT: 33.4 % — AB (ref 36.0–46.0)
Hemoglobin: 10.8 g/dL — ABNORMAL LOW (ref 12.0–15.0)
LYMPHS ABS: 0.8 10*3/uL (ref 0.7–4.0)
LYMPHS PCT: 10 %
MCH: 30.5 pg (ref 26.0–34.0)
MCHC: 32.3 g/dL (ref 30.0–36.0)
MCV: 94.4 fL (ref 78.0–100.0)
MONO ABS: 1.2 10*3/uL — AB (ref 0.1–1.0)
Monocytes Relative: 16 %
NEUTROS ABS: 5.6 10*3/uL (ref 1.7–7.7)
Neutrophils Relative %: 73 %
Platelets: 194 10*3/uL (ref 150–400)
RBC: 3.54 MIL/uL — AB (ref 3.87–5.11)
RDW: 16.1 % — ABNORMAL HIGH (ref 11.5–15.5)
WBC: 7.6 10*3/uL (ref 4.0–10.5)

## 2017-05-19 LAB — SAMPLE TO BLOOD BANK

## 2017-05-19 MED ORDER — FENTANYL CITRATE (PF) 100 MCG/2ML IJ SOLN: 100.0000 ug | Freq: Once | INTRAMUSCULAR | Status: DC

## 2017-05-19 MED ORDER — ACETAMINOPHEN 325 MG PO TABS
650.0000 mg | ORAL_TABLET | Freq: Once | ORAL | Status: AC
  Administered 2017-05-19: 650 mg via ORAL
  Filled 2017-05-19: qty 2

## 2017-05-19 NOTE — Discharge Instructions (Signed)
[name] can take Tylenol 650 mg every 4 hours in addition to the tramadol that she was previously prescribed as needed for pain.  Elevate her legs above her heart to help with swelling.  She should get up out of bed and into a chair with legs elevated 4-6 times daily for 30 minutes at a time at least.  If her condition deteriorates, or if any concern have her return or contact her primary care physician

## 2017-05-19 NOTE — ED Provider Notes (Signed)
[facility] Provider Note   CSN: [id number] Arrival date & time: 05/19/17  1408     History   Chief Complaint Chief Complaint  Patient presents with  . Leg Swelling    HPI [name] [name] is a 83 y.o. female.  Level 5 caveat dementia History is obtained by telephone from Ms [name] [name], supervisor at skilled nursing facility as well as from caregiver who accompanies her HPI Patient was seen here on 05/10/2017 as a result of a fall that she is staying the night before at skilled nursing facility.  She was unable to ambulate with her walker.  She presents today as she has increasing bilateral leg swelling and complains of pain that is not well controlled with Tylenol in her legs.  She had an extensive evaluation to include plain films of left tibia and fibula, CT scan of pelvis and plain films of left ankle and left foot, showing no fractures, hematomas were present in gluteal muscles, as well as hematomas in the lower leg.  Was treated with Keflex for possible urinary tract infection however culture came back negative.  Also treated with tramadol for pain.. Past Medical History:  Diagnosis Date  . Alzheimer's dementia   . Anemia    Gastritis/duodenitis  . Atrial fibrillation (HCC)   . Diverticulosis   . Dyslipidemia   . GERD (gastroesophageal reflux disease)   . Hx of colonic polyps 2010   2010: Colonoscopy and polypectomy; diverticula also noted  . Hyperlipidemia   . Hypothyroid   . Mental retardation   . Osteoarthritis   . Pneumonia 01/1999 and  . Protein calorie malnutrition (HCC)   . Seizure disorder [facility](HCC)     Patient Active Problem List   Diagnosis Date Noted  . Thrombocytopenia (HCC) 11/16/2012  . Conjunctivitis, allergic 11/16/2012  . Laboratory test 07/14/2012  . Hyperlipidemia 01/22/2011  . Anemia, normocytic normochromic-resolved 02/05/2009  . Atrial fibrillation (HCC) 02/05/2009  . SEIZURE DISORDER 02/05/2009    Past Surgical  History:  Procedure Laterality Date  . CHOLECYSTECTOMY    . COLONOSCOPY W/ POLYPECTOMY  01/18/2009    simple adenomas/moderate diverticulosis, torturous colon, normal TI, vascular prominence near dentate line.  . ESOPHAGOGASTRODUODENOSCOPY  01/18/2009   Severe chronic gastritis, peptic duodenitis  . HEMORRHOID SURGERY      OB History    No data available       Home Medications    Prior to Admission medications   Medication Sig Start Date End Date Taking? Authorizing Provider  amiodarone (PACERONE) 200 MG tablet TAKE 1 TABLET BY MOUTH ONCE DAILY. 07/02/14   Jodelle GrossLawrence, Kathryn M, NP  aspirin 81 MG EC tablet Take 81 mg by mouth every morning.     [provider]  beta carotene w/minerals (OCUVITE) tablet Take 1 tablet by mouth daily.    [provider]  Calcium Carbonate-Vit D-Min (CALTRATE 600+D PLUS) 600-400 MG-UNIT per tablet Take 1 tablet by mouth at bedtime.     [provider]  cephALEXin (KEFLEX) 500 MG capsule Take 1 capsule (500 mg total) 4 (four) times daily by mouth. 05/10/17   Mesner, Barbara CowerJason, MD  diclofenac sodium (VOLTAREN) 1 % GEL Apply 2 g topically 2 (two) times daily. Apply 2g to right shoulder twice a day for pain    [provider]  docusate sodium (COLACE) 100 MG capsule Take 100 mg by mouth 2 (two) times daily.     [provider]  donepezil (ARICEPT) 5 MG tablet Take 5  mg by mouth at bedtime.     [provider]  Epinastine HCl 0.05 % ophthalmic solution Place 1 drop into both eyes 2 (two) times daily as needed (allergic conjunctivitis). 11/16/12   Jodelle Gross, NP  guaifenesin (ROBITUSSIN) 100 MG/5ML syrup Take 100 mg every 6 (six) hours as needed by mouth for cough.    [provider]  iron polysaccharides (NIFEREX) 150 MG capsule Take 150 mg by mouth daily.     [provider]  levETIRAcetam (KEPPRA) 500 MG tablet Take 500 mg by mouth 2 (two) times daily.      [provider]    levofloxacin (LEVAQUIN) 25 MG/ML solution Take 20 mLs (500 mg total) by mouth daily. Take Rx for 10 days Patient not taking: Reported on 05/10/2017 02/02/17   Donnetta Hutching, MD  levothyroxine (SYNTHROID, LEVOTHROID) 75 MCG tablet Take 75 mcg by mouth every morning.     [provider]  meclizine (ANTIVERT) 25 MG tablet Take 12.5 mg by mouth 3 (three) times daily as needed for dizziness.     [provider]  omeprazole (PRILOSEC) 20 MG capsule Take 20 mg by mouth 2 (two) times daily before a meal.     [provider]  potassium chloride (K-DUR,KLOR-CON) 10 MEQ tablet Take 10 mEq by mouth daily.     [provider]  QC HYDROGEN PEROXIDE EX Place 2 drops into the left ear 2 (two) times daily.    [provider]  senna-docusate (SENOKOT-S) 8.6-50 MG tablet Take 1 tablet by mouth at bedtime.    [provider]  traMADol (ULTRAM) 50 MG tablet Take 50 mg by mouth 3 (three) times daily.    [provider]    Family History No family history on file.  Social History Social History   Tobacco Use  . Smoking status: Never Smoker  . Smokeless tobacco: Never Used  . Tobacco comment: tobacco use - no  Substance Use Topics  . Alcohol use: No  . Drug use: No     Allergies   Patient has no known allergies.   Review of Systems Review of Systems  Unable to perform ROS: Dementia  HENT: Positive for hearing loss.        Chronically hard of hearing hard of hearing  Musculoskeletal: Positive for arthralgias and myalgias.  Skin: Positive for color change.       Ecchymoses of lower extremities  Neurological: Negative.      Physical Exam Updated Vital Signs BP (!) 101/55   Pulse 65   Temp 97.8 F (36.6 C) (Temporal)   Resp 20   Ht 5' (1.524 m)   Wt 45.4 kg (100 lb)   SpO2 96%   BMI 19.53 kg/m   Physical Exam  Constitutional:  Frail, chronically ill-appearing  HENT:  Head: Normocephalic and atraumatic.  Eyes: EOM are  normal.  Neck: Neck supple. No tracheal deviation present. No thyromegaly present.  Cardiovascular: Normal rate.  No murmur heard. Pulmonary/Chest: Effort normal and breath sounds normal.  Abdominal: Soft. Bowel sounds are normal. She exhibits no distension. There is no tenderness.  Musculoskeletal: Normal range of motion. She exhibits edema. She exhibits no tenderness.  Left lower extrmity ecchymotic below the knee foot edematuos & tender. No calf tendernes dp pulse 1+.Rle 2+ pretibial edema, non tender. Pelvis stable r buttock diffusely eccymotic/ entiure spine non tender  Neurological: She is alert. Coordination normal.  Skin: Skin is warm and dry. Capillary refill takes less  than 2 seconds. No rash noted.  Psychiatric: She has a normal mood and affect.  Nursing note and vitals reviewed.    ED Treatments / Results  Labs (all labs ordered are listed, but only abnormal results are displayed) Labs Reviewed  CBC WITH DIFFERENTIAL/PLATELET  COMPREHENSIVE METABOLIC PANEL    EKG  EKG Interpretation None       Radiology No results found.  Procedures Procedures (including critical care time)  Medications Ordered in ED Medications  acetaminophen (TYLENOL) tablet 650 mg (not administered)     Initial Impression / Assessment and Plan / ED Course  I have reviewed the triage vital signs and the nursing notes.  Pertinent labs & imaging results that were available during my care of the patient were reviewed by me and considered in my medical decision making (see chart for details).     4:10 PM patient resting comfortably after treatment with Tylenol. I will feel the patient needs further imaging studies.  Her hemoglobin is stable she remains hemodynamically stable her renal function has improved over 9 days ago Plan she can take Tylenol in addition to tramadol prescribed elevate legs Final Clinical Impressions(s) / ED Diagnoses  Diagnosis#1 contusions multiple sites #2  anemia Final diagnoses:  None    ED Discharge Orders    None       Doug SouJacubowitz, Jaquez Farrington, MD 05/19/17 [phone]

## 2017-05-19 NOTE — ED Triage Notes (Signed)
Pt from [facility], brought in by caregiver c/o BLE pain and swelling, left hip pain after fall x 2 weeks ago. Caregiver reports pt was seen in ED at time of fall.

## 2017-05-21 DIAGNOSIS — M6281 Muscle weakness (generalized): Secondary | ICD-10-CM | POA: Diagnosis not present

## 2017-05-21 DIAGNOSIS — G309 Alzheimer's disease, unspecified: Secondary | ICD-10-CM | POA: Diagnosis not present

## 2017-05-21 DIAGNOSIS — S7011XD Contusion of right thigh, subsequent encounter: Secondary | ICD-10-CM | POA: Diagnosis not present

## 2017-05-21 DIAGNOSIS — Z9181 History of falling: Secondary | ICD-10-CM | POA: Diagnosis not present

## 2017-05-21 DIAGNOSIS — R609 Edema, unspecified: Secondary | ICD-10-CM | POA: Diagnosis not present

## 2017-05-21 DIAGNOSIS — S7001XD Contusion of right hip, subsequent encounter: Secondary | ICD-10-CM | POA: Diagnosis not present

## 2017-05-21 DIAGNOSIS — F0280 Dementia in other diseases classified elsewhere without behavioral disturbance: Secondary | ICD-10-CM | POA: Diagnosis not present

## 2017-05-21 DIAGNOSIS — I482 Chronic atrial fibrillation: Secondary | ICD-10-CM | POA: Diagnosis not present

## 2017-05-21 DIAGNOSIS — R2689 Other abnormalities of gait and mobility: Secondary | ICD-10-CM | POA: Diagnosis not present

## 2017-05-24 DIAGNOSIS — F0280 Dementia in other diseases classified elsewhere without behavioral disturbance: Secondary | ICD-10-CM | POA: Diagnosis not present

## 2017-05-24 DIAGNOSIS — S7001XD Contusion of right hip, subsequent encounter: Secondary | ICD-10-CM | POA: Diagnosis not present

## 2017-05-24 DIAGNOSIS — S7011XD Contusion of right thigh, subsequent encounter: Secondary | ICD-10-CM | POA: Diagnosis not present

## 2017-05-24 DIAGNOSIS — G309 Alzheimer's disease, unspecified: Secondary | ICD-10-CM | POA: Diagnosis not present

## 2017-05-24 DIAGNOSIS — R609 Edema, unspecified: Secondary | ICD-10-CM | POA: Diagnosis not present

## 2017-05-24 DIAGNOSIS — M6281 Muscle weakness (generalized): Secondary | ICD-10-CM | POA: Diagnosis not present

## 2017-05-27 DIAGNOSIS — R609 Edema, unspecified: Secondary | ICD-10-CM | POA: Diagnosis not present

## 2017-05-27 DIAGNOSIS — S7011XD Contusion of right thigh, subsequent encounter: Secondary | ICD-10-CM | POA: Diagnosis not present

## 2017-05-27 DIAGNOSIS — G309 Alzheimer's disease, unspecified: Secondary | ICD-10-CM | POA: Diagnosis not present

## 2017-05-27 DIAGNOSIS — F0280 Dementia in other diseases classified elsewhere without behavioral disturbance: Secondary | ICD-10-CM | POA: Diagnosis not present

## 2017-05-27 DIAGNOSIS — S7001XD Contusion of right hip, subsequent encounter: Secondary | ICD-10-CM | POA: Diagnosis not present

## 2017-05-27 DIAGNOSIS — M6281 Muscle weakness (generalized): Secondary | ICD-10-CM | POA: Diagnosis not present

## 2017-05-28 DIAGNOSIS — R2242 Localized swelling, mass and lump, left lower limb: Secondary | ICD-10-CM | POA: Diagnosis not present

## 2017-05-28 DIAGNOSIS — M25551 Pain in right hip: Secondary | ICD-10-CM | POA: Diagnosis not present

## 2017-06-02 DIAGNOSIS — S7011XD Contusion of right thigh, subsequent encounter: Secondary | ICD-10-CM | POA: Diagnosis not present

## 2017-06-02 DIAGNOSIS — F0280 Dementia in other diseases classified elsewhere without behavioral disturbance: Secondary | ICD-10-CM | POA: Diagnosis not present

## 2017-06-02 DIAGNOSIS — R609 Edema, unspecified: Secondary | ICD-10-CM | POA: Diagnosis not present

## 2017-06-02 DIAGNOSIS — S7001XD Contusion of right hip, subsequent encounter: Secondary | ICD-10-CM | POA: Diagnosis not present

## 2017-06-02 DIAGNOSIS — G309 Alzheimer's disease, unspecified: Secondary | ICD-10-CM | POA: Diagnosis not present

## 2017-06-02 DIAGNOSIS — M6281 Muscle weakness (generalized): Secondary | ICD-10-CM | POA: Diagnosis not present

## 2017-06-03 DIAGNOSIS — G309 Alzheimer's disease, unspecified: Secondary | ICD-10-CM | POA: Diagnosis not present

## 2017-06-03 DIAGNOSIS — M6281 Muscle weakness (generalized): Secondary | ICD-10-CM | POA: Diagnosis not present

## 2017-06-03 DIAGNOSIS — S7011XD Contusion of right thigh, subsequent encounter: Secondary | ICD-10-CM | POA: Diagnosis not present

## 2017-06-03 DIAGNOSIS — R609 Edema, unspecified: Secondary | ICD-10-CM | POA: Diagnosis not present

## 2017-06-03 DIAGNOSIS — F0280 Dementia in other diseases classified elsewhere without behavioral disturbance: Secondary | ICD-10-CM | POA: Diagnosis not present

## 2017-06-03 DIAGNOSIS — S7001XD Contusion of right hip, subsequent encounter: Secondary | ICD-10-CM | POA: Diagnosis not present

## 2017-06-04 DIAGNOSIS — S7001XD Contusion of right hip, subsequent encounter: Secondary | ICD-10-CM | POA: Diagnosis not present

## 2017-06-04 DIAGNOSIS — G309 Alzheimer's disease, unspecified: Secondary | ICD-10-CM | POA: Diagnosis not present

## 2017-06-04 DIAGNOSIS — M6281 Muscle weakness (generalized): Secondary | ICD-10-CM | POA: Diagnosis not present

## 2017-06-04 DIAGNOSIS — S7011XD Contusion of right thigh, subsequent encounter: Secondary | ICD-10-CM | POA: Diagnosis not present

## 2017-06-04 DIAGNOSIS — R609 Edema, unspecified: Secondary | ICD-10-CM | POA: Diagnosis not present

## 2017-06-04 DIAGNOSIS — F0280 Dementia in other diseases classified elsewhere without behavioral disturbance: Secondary | ICD-10-CM | POA: Diagnosis not present

## 2017-06-07 DIAGNOSIS — F0280 Dementia in other diseases classified elsewhere without behavioral disturbance: Secondary | ICD-10-CM | POA: Diagnosis not present

## 2017-06-07 DIAGNOSIS — G309 Alzheimer's disease, unspecified: Secondary | ICD-10-CM | POA: Diagnosis not present

## 2017-06-07 DIAGNOSIS — S7011XD Contusion of right thigh, subsequent encounter: Secondary | ICD-10-CM | POA: Diagnosis not present

## 2017-06-07 DIAGNOSIS — M6281 Muscle weakness (generalized): Secondary | ICD-10-CM | POA: Diagnosis not present

## 2017-06-07 DIAGNOSIS — R609 Edema, unspecified: Secondary | ICD-10-CM | POA: Diagnosis not present

## 2017-06-07 DIAGNOSIS — S7001XD Contusion of right hip, subsequent encounter: Secondary | ICD-10-CM | POA: Diagnosis not present

## 2017-06-10 DIAGNOSIS — G309 Alzheimer's disease, unspecified: Secondary | ICD-10-CM | POA: Diagnosis not present

## 2017-06-10 DIAGNOSIS — S7001XD Contusion of right hip, subsequent encounter: Secondary | ICD-10-CM | POA: Diagnosis not present

## 2017-06-10 DIAGNOSIS — R609 Edema, unspecified: Secondary | ICD-10-CM | POA: Diagnosis not present

## 2017-06-10 DIAGNOSIS — S7011XD Contusion of right thigh, subsequent encounter: Secondary | ICD-10-CM | POA: Diagnosis not present

## 2017-06-10 DIAGNOSIS — M6281 Muscle weakness (generalized): Secondary | ICD-10-CM | POA: Diagnosis not present

## 2017-06-10 DIAGNOSIS — F0280 Dementia in other diseases classified elsewhere without behavioral disturbance: Secondary | ICD-10-CM | POA: Diagnosis not present

## 2017-06-11 DIAGNOSIS — S7011XD Contusion of right thigh, subsequent encounter: Secondary | ICD-10-CM | POA: Diagnosis not present

## 2017-06-11 DIAGNOSIS — S7001XD Contusion of right hip, subsequent encounter: Secondary | ICD-10-CM | POA: Diagnosis not present

## 2017-06-11 DIAGNOSIS — F0280 Dementia in other diseases classified elsewhere without behavioral disturbance: Secondary | ICD-10-CM | POA: Diagnosis not present

## 2017-06-11 DIAGNOSIS — G309 Alzheimer's disease, unspecified: Secondary | ICD-10-CM | POA: Diagnosis not present

## 2017-06-11 DIAGNOSIS — R609 Edema, unspecified: Secondary | ICD-10-CM | POA: Diagnosis not present

## 2017-06-11 DIAGNOSIS — M6281 Muscle weakness (generalized): Secondary | ICD-10-CM | POA: Diagnosis not present

## 2017-06-14 DIAGNOSIS — S7001XD Contusion of right hip, subsequent encounter: Secondary | ICD-10-CM | POA: Diagnosis not present

## 2017-06-14 DIAGNOSIS — F0280 Dementia in other diseases classified elsewhere without behavioral disturbance: Secondary | ICD-10-CM | POA: Diagnosis not present

## 2017-06-14 DIAGNOSIS — G309 Alzheimer's disease, unspecified: Secondary | ICD-10-CM | POA: Diagnosis not present

## 2017-06-14 DIAGNOSIS — M6281 Muscle weakness (generalized): Secondary | ICD-10-CM | POA: Diagnosis not present

## 2017-06-14 DIAGNOSIS — S7011XD Contusion of right thigh, subsequent encounter: Secondary | ICD-10-CM | POA: Diagnosis not present

## 2017-06-14 DIAGNOSIS — R609 Edema, unspecified: Secondary | ICD-10-CM | POA: Diagnosis not present

## 2017-06-18 ENCOUNTER — Encounter (HOSPITAL_COMMUNITY): Payer: Self-pay

## 2017-06-18 ENCOUNTER — Emergency Department (HOSPITAL_COMMUNITY): Payer: Medicare Other

## 2017-06-18 ENCOUNTER — Inpatient Hospital Stay (HOSPITAL_COMMUNITY)
Admission: EM | Admit: 2017-06-18 | Discharge: 2017-06-22 | DRG: 308 | Disposition: E | Payer: Medicare Other | Attending: Internal Medicine | Admitting: Internal Medicine

## 2017-06-18 DIAGNOSIS — M6281 Muscle weakness (generalized): Secondary | ICD-10-CM | POA: Diagnosis not present

## 2017-06-18 DIAGNOSIS — Z79891 Long term (current) use of opiate analgesic: Secondary | ICD-10-CM

## 2017-06-18 DIAGNOSIS — Z79899 Other long term (current) drug therapy: Secondary | ICD-10-CM

## 2017-06-18 DIAGNOSIS — I959 Hypotension, unspecified: Secondary | ICD-10-CM | POA: Diagnosis present

## 2017-06-18 DIAGNOSIS — H9190 Unspecified hearing loss, unspecified ear: Secondary | ICD-10-CM | POA: Diagnosis present

## 2017-06-18 DIAGNOSIS — G309 Alzheimer's disease, unspecified: Secondary | ICD-10-CM | POA: Diagnosis not present

## 2017-06-18 DIAGNOSIS — J9601 Acute respiratory failure with hypoxia: Secondary | ICD-10-CM | POA: Diagnosis not present

## 2017-06-18 DIAGNOSIS — S7011XD Contusion of right thigh, subsequent encounter: Secondary | ICD-10-CM | POA: Diagnosis not present

## 2017-06-18 DIAGNOSIS — Z9049 Acquired absence of other specified parts of digestive tract: Secondary | ICD-10-CM | POA: Diagnosis not present

## 2017-06-18 DIAGNOSIS — E039 Hypothyroidism, unspecified: Secondary | ICD-10-CM | POA: Diagnosis present

## 2017-06-18 DIAGNOSIS — I4891 Unspecified atrial fibrillation: Secondary | ICD-10-CM | POA: Diagnosis not present

## 2017-06-18 DIAGNOSIS — Z7982 Long term (current) use of aspirin: Secondary | ICD-10-CM | POA: Diagnosis not present

## 2017-06-18 DIAGNOSIS — G40909 Epilepsy, unspecified, not intractable, without status epilepticus: Secondary | ICD-10-CM | POA: Diagnosis present

## 2017-06-18 DIAGNOSIS — R609 Edema, unspecified: Secondary | ICD-10-CM | POA: Diagnosis not present

## 2017-06-18 DIAGNOSIS — F0390 Unspecified dementia without behavioral disturbance: Secondary | ICD-10-CM | POA: Diagnosis present

## 2017-06-18 DIAGNOSIS — S7001XD Contusion of right hip, subsequent encounter: Secondary | ICD-10-CM | POA: Diagnosis not present

## 2017-06-18 DIAGNOSIS — J9621 Acute and chronic respiratory failure with hypoxia: Secondary | ICD-10-CM | POA: Diagnosis not present

## 2017-06-18 DIAGNOSIS — I480 Paroxysmal atrial fibrillation: Principal | ICD-10-CM | POA: Diagnosis present

## 2017-06-18 DIAGNOSIS — Z515 Encounter for palliative care: Secondary | ICD-10-CM

## 2017-06-18 DIAGNOSIS — Z7189 Other specified counseling: Secondary | ICD-10-CM | POA: Diagnosis not present

## 2017-06-18 DIAGNOSIS — Z66 Do not resuscitate: Secondary | ICD-10-CM | POA: Diagnosis present

## 2017-06-18 DIAGNOSIS — Z86010 Personal history of colonic polyps: Secondary | ICD-10-CM | POA: Diagnosis not present

## 2017-06-18 DIAGNOSIS — R54 Age-related physical debility: Secondary | ICD-10-CM | POA: Diagnosis present

## 2017-06-18 DIAGNOSIS — F79 Unspecified intellectual disabilities: Secondary | ICD-10-CM | POA: Diagnosis present

## 2017-06-18 DIAGNOSIS — E785 Hyperlipidemia, unspecified: Secondary | ICD-10-CM | POA: Diagnosis present

## 2017-06-18 DIAGNOSIS — F0280 Dementia in other diseases classified elsewhere without behavioral disturbance: Secondary | ICD-10-CM | POA: Diagnosis not present

## 2017-06-18 DIAGNOSIS — K219 Gastro-esophageal reflux disease without esophagitis: Secondary | ICD-10-CM | POA: Diagnosis present

## 2017-06-18 DIAGNOSIS — R64 Cachexia: Secondary | ICD-10-CM | POA: Diagnosis present

## 2017-06-18 DIAGNOSIS — I509 Heart failure, unspecified: Secondary | ICD-10-CM | POA: Diagnosis not present

## 2017-06-18 DIAGNOSIS — R0602 Shortness of breath: Secondary | ICD-10-CM | POA: Diagnosis not present

## 2017-06-18 DIAGNOSIS — I5033 Acute on chronic diastolic (congestive) heart failure: Secondary | ICD-10-CM | POA: Diagnosis present

## 2017-06-18 DIAGNOSIS — I5031 Acute diastolic (congestive) heart failure: Secondary | ICD-10-CM | POA: Diagnosis not present

## 2017-06-18 DIAGNOSIS — Z681 Body mass index (BMI) 19 or less, adult: Secondary | ICD-10-CM

## 2017-06-18 DIAGNOSIS — R569 Unspecified convulsions: Secondary | ICD-10-CM | POA: Diagnosis not present

## 2017-06-18 HISTORY — DX: Unspecified dementia, unspecified severity, without behavioral disturbance, psychotic disturbance, mood disturbance, and anxiety: F03.90

## 2017-06-18 LAB — BASIC METABOLIC PANEL
Anion gap: 14 (ref 5–15)
BUN: 56 mg/dL — AB (ref 6–20)
CALCIUM: 9.4 mg/dL (ref 8.9–10.3)
CO2: 20 mmol/L — ABNORMAL LOW (ref 22–32)
Chloride: 110 mmol/L (ref 101–111)
Creatinine, Ser: 1.46 mg/dL — ABNORMAL HIGH (ref 0.44–1.00)
GFR calc Af Amer: 36 mL/min — ABNORMAL LOW (ref >60)
GFR, EST NON AFRICAN AMERICAN: 31 mL/min — AB (ref >60)
GLUCOSE: 115 mg/dL — AB (ref 65–99)
Potassium: 4.8 mmol/L (ref 3.5–5.1)
SODIUM: 144 mmol/L (ref 135–145)

## 2017-06-18 LAB — CBC
HEMATOCRIT: 44.9 % (ref 36.0–46.0)
Hemoglobin: 13.8 g/dL (ref 12.0–15.0)
MCH: 28.2 pg (ref 26.0–34.0)
MCHC: 30.7 g/dL (ref 30.0–36.0)
MCV: 91.8 fL (ref 78.0–100.0)
PLATELETS: 308 10*3/uL (ref 150–400)
RBC: 4.89 MIL/uL (ref 3.87–5.11)
RDW: 14.9 % (ref 11.5–15.5)
WBC: 12.2 10*3/uL — ABNORMAL HIGH (ref 4.0–10.5)

## 2017-06-18 LAB — BLOOD GAS, ARTERIAL
ACID-BASE DEFICIT: 4.2 mmol/L — AB (ref 0.0–2.0)
BICARBONATE: 21.3 mmol/L (ref 20.0–28.0)
DRAWN BY: 105551
O2 CONTENT: 12 L/min
O2 SAT: 90.7 %
pCO2 arterial: 30.9 mmHg — ABNORMAL LOW (ref 32.0–48.0)
pH, Arterial: 7.419 (ref 7.350–7.450)
pO2, Arterial: 66.6 mmHg — ABNORMAL LOW (ref 83.0–108.0)

## 2017-06-18 LAB — BRAIN NATRIURETIC PEPTIDE: B Natriuretic Peptide: 799 pg/mL — ABNORMAL HIGH (ref 0.0–100.0)

## 2017-06-18 LAB — INFLUENZA PANEL BY PCR (TYPE A & B)
INFLAPCR: NEGATIVE
INFLBPCR: NEGATIVE

## 2017-06-18 LAB — TROPONIN I

## 2017-06-18 LAB — TSH: TSH: 2.764 u[IU]/mL (ref 0.350–4.500)

## 2017-06-18 MED ORDER — METOPROLOL TARTRATE 5 MG/5ML IV SOLN
INTRAVENOUS | Status: AC
  Filled 2017-06-18: qty 5

## 2017-06-18 MED ORDER — LEVALBUTEROL HCL 1.25 MG/0.5ML IN NEBU
INHALATION_SOLUTION | RESPIRATORY_TRACT | Status: AC
  Administered 2017-06-18: 1.25 mg
  Filled 2017-06-18: qty 0.5

## 2017-06-18 MED ORDER — FUROSEMIDE 10 MG/ML IJ SOLN
40.0000 mg | Freq: Once | INTRAMUSCULAR | Status: AC
  Administered 2017-06-18: 40 mg via INTRAVENOUS
  Filled 2017-06-18: qty 4

## 2017-06-18 MED ORDER — DILTIAZEM LOAD VIA INFUSION
5.0000 mg | Freq: Once | INTRAVENOUS | Status: AC
  Administered 2017-06-18: 5 mg via INTRAVENOUS
  Filled 2017-06-18: qty 5

## 2017-06-18 MED ORDER — SODIUM CHLORIDE 0.9 % IV BOLUS (SEPSIS)
500.0000 mL | Freq: Once | INTRAVENOUS | Status: AC
  Administered 2017-06-18: 500 mL via INTRAVENOUS

## 2017-06-18 MED ORDER — ADENOSINE 6 MG/2ML IV SOLN: 6.0000 mg | Freq: Once | INTRAVENOUS | Status: DC

## 2017-06-18 MED ORDER — DILTIAZEM HCL-DEXTROSE 100-5 MG/100ML-% IV SOLN (PREMIX)
5.0000 mg/h | INTRAVENOUS | Status: DC
  Administered 2017-06-18: 15 mg/h via INTRAVENOUS
  Administered 2017-06-18: 5 mg/h via INTRAVENOUS
  Administered 2017-06-19 (×2): 12.5 mg/h via INTRAVENOUS
  Administered 2017-06-20: 5 mg/h via INTRAVENOUS
  Administered 2017-06-20: 10 mg/h via INTRAVENOUS
  Filled 2017-06-18: qty 200
  Filled 2017-06-18 (×5): qty 100

## 2017-06-18 MED ORDER — LORAZEPAM 2 MG/ML IJ SOLN
0.5000 mg | Freq: Once | INTRAMUSCULAR | Status: AC
  Administered 2017-06-18: 0.5 mg via INTRAVENOUS
  Filled 2017-06-18: qty 1

## 2017-06-18 MED ORDER — ADENOSINE 6 MG/2ML IV SOLN
INTRAVENOUS | Status: AC
  Filled 2017-06-18: qty 10

## 2017-06-18 MED ORDER — LEVETIRACETAM 500 MG PO TABS
500.0000 mg | ORAL_TABLET | Freq: Two times a day (BID) | ORAL | Status: DC
  Administered 2017-06-19: 500 mg via ORAL
  Filled 2017-06-18 (×2): qty 1

## 2017-06-18 MED ORDER — METOPROLOL TARTRATE 5 MG/5ML IV SOLN
5.0000 mg | Freq: Once | INTRAVENOUS | Status: AC
  Administered 2017-06-18: 2.5 mg via INTRAVENOUS

## 2017-06-18 MED ORDER — IPRATROPIUM BROMIDE 0.02 % IN SOLN
RESPIRATORY_TRACT | Status: AC
  Administered 2017-06-18: 0.5 mg
  Filled 2017-06-18: qty 2.5

## 2017-06-18 MED ORDER — AMIODARONE HCL 200 MG PO TABS: 200.0000 mg | ORAL_TABLET | Freq: Every day | ORAL | Status: DC

## 2017-06-18 MED ORDER — LEVALBUTEROL HCL 0.63 MG/3ML IN NEBU
INHALATION_SOLUTION | RESPIRATORY_TRACT | Status: AC
  Administered 2017-06-18: 0.63 mg
  Filled 2017-06-18: qty 3

## 2017-06-18 NOTE — ED Notes (Signed)
RT in room to set up bi -pap.  Pt sats 83% on non rebreather.

## 2017-06-18 NOTE — ED Notes (Signed)
Pt having some visible respiratory distress.  sats in the 70's with good waveform.  Increasing oxygen to Non rebreather.  Dr. Effie ShyWentz at bedside.

## 2017-06-18 NOTE — H&P (Addendum)
History and Physical    [name] GimenezFlorence A [name] ZOX:[id number] DOB: 04/15/1928 DOA: 06/12/2017  PCP: [name], [name], MD   Patient coming from: [facility], [place].  Chief Complaint: Body aches, cough  HPI: [name] GimenezFlorence A [name] is a 83 y.o. female with medical history significant for atrial fibrillation, seizure disorder.  History was obtained from chart from patient's relative.  Patient was brought to ED from nursing home with reports of generalized body aches, cough, of 2 days duration.  Patient has a distant relative/friend, who is pts HCPOA, but no signed documents, who confirmed patient's DNR status, the patient is very hard of hearing and has dementia, was last seen yesterday patient was complaining of not feeling well but no specific complaints. Patient has no close relative.  ED Course: Tachycardia- 150s and hypotension-pressure 87/73, on arrival to ED, O2 sats 99%.  Patient was given 1 L normal saline, came short of breath and was placed on BiPAP.  Chest x-ray showed congestive heart failure.  Patient was given a dose of metoprolol 5 mg, Ativan 0.5 mg, dose of adenosine 6 mg , and was given 5 mg dose of Cardizem and subsequently placed on Cardizem drip.  Hospitalist was called to admit for A. fib with RVR  Review of Systems: Unable to to assess due to patient's dementia.  Past Medical History:  Diagnosis Date  . Alzheimer's dementia   . Anemia    Gastritis/duodenitis  . Atrial fibrillation (HCC)   . Dementia   . Diverticulosis   . Dyslipidemia   . GERD (gastroesophageal reflux disease)   . Hx of colonic polyps 2010   2010: Colonoscopy and polypectomy; diverticula also noted  . Hyperlipidemia   . Hypothyroid   . Mental retardation   . Osteoarthritis   . Pneumonia 01/1999 and  . Protein calorie malnutrition (HCC)   . Protein calorie malnutrition (HCC)   . Seizure disorder [facility](HCC)     Past Surgical History:  Procedure Laterality Date  . CHOLECYSTECTOMY    . COLONOSCOPY W/  POLYPECTOMY  01/18/2009    simple adenomas/moderate diverticulosis, torturous colon, normal TI, vascular prominence near dentate line.  . ESOPHAGOGASTRODUODENOSCOPY  01/18/2009   Severe chronic gastritis, peptic duodenitis  . HEMORRHOID SURGERY       reports that  has never smoked. she has never used smokeless tobacco. She reports that she does not drink alcohol or use drugs.  No Known Allergies  No family history on file.   Prior to Admission medications   Medication Sig Start Date End Date Taking? Authorizing Provider  acetaminophen (TYLENOL) 325 MG tablet Take 325 mg by mouth every 6 (six) hours as needed for mild pain or headache.   Yes [provider]  amiodarone (PACERONE) 200 MG tablet TAKE 1 TABLET BY MOUTH ONCE DAILY. 07/02/14  Yes Jodelle GrossLawrence, Kathryn M, NP  aspirin 81 MG EC tablet Take 81 mg by mouth every morning.    Yes [provider]  beta carotene w/minerals (OCUVITE) tablet Take 1 tablet by mouth daily.   Yes [provider]  Calcium Carbonate-Vit D-Min (CALTRATE 600+D PLUS) 600-400 MG-UNIT per tablet Take 1 tablet by mouth at bedtime.    Yes [provider]  diclofenac sodium (VOLTAREN) 1 % GEL Apply 2 g topically 2 (two) times daily. Apply 2g to right shoulder twice a day for pain   Yes [provider]  docusate sodium (COLACE) 100 MG capsule Take 100 mg by mouth 2 (two) times daily.  Yes [provider]  donepezil (ARICEPT) 5 MG tablet Take 5 mg by mouth at bedtime.    Yes [provider]  Epinastine HCl 0.05 % ophthalmic solution Place 1 drop into both eyes 2 (two) times daily as needed (allergic conjunctivitis). 11/16/12  Yes Jodelle Gross, NP  iron polysaccharides (NIFEREX) 150 MG capsule Take 150 mg by mouth daily.    Yes [provider]  levETIRAcetam (KEPPRA) 500 MG tablet Take 500 mg by mouth 2 (two) times daily.     Yes [provider]  levothyroxine (SYNTHROID, LEVOTHROID) 75  MCG tablet Take 75 mcg by mouth every morning.    Yes [provider]  meclizine (ANTIVERT) 25 MG tablet Take 12.5 mg by mouth 3 (three) times daily as needed for dizziness.    Yes [provider]  omeprazole (PRILOSEC) 20 MG capsule Take 20 mg by mouth 2 (two) times daily before a meal.    Yes [provider]  potassium chloride (K-DUR,KLOR-CON) 10 MEQ tablet Take 10 mEq by mouth daily.    Yes [provider]  QC HYDROGEN PEROXIDE EX Place 2 drops into the left ear 2 (two) times daily.   Yes [provider]  senna-docusate (SENOKOT-S) 8.6-50 MG tablet Take 1 tablet by mouth at bedtime.   Yes [provider]  traMADol (ULTRAM) 50 MG tablet Take 50 mg by mouth 3 (three) times daily.   Yes [provider]  guaifenesin (ROBITUSSIN) 100 MG/5ML syrup Take 100 mg every 6 (six) hours as needed by mouth for cough.    [provider]    Physical Exam: Not fully able to examine patient due to dementia. Vitals:   06/12/2017 1830 06/04/2017 1840 05/22/2017 1900 06/05/2017 1931  BP: 102/73 100/84 103/84 100/69  Pulse: (!) 107 (!) 124 96 (!) 48  Resp: (!) 25 (!) 24 (!) 23 (!) 21  SpO2: (!) 82% (!) 79% (!) 88% 98%  Weight:        Constitutional: NAD, calm, comfortable.  Vitals:   05/30/2017 1830 06/11/2017 1840 06/05/2017 1900 06/04/2017 1931  BP: 102/73 100/84 103/84 100/69  Pulse: (!) 107 (!) 124 96 (!) 48  Resp: (!) 25 (!) 24 (!) 23 (!) 21  SpO2: (!) 82% (!) 79% (!) 88% 98%  Weight:       Eyes: PERRL, lids and conjunctivae normal ENMT: at the time of my exam patient was on BiPAP Neck:  no masses, no thyromegaly Respiratory: On BiPAP equal breath sounds bilaterally to auscultation only  Cardiovascular: Tachycardia, no murmurs appreciated, patient wearing compression stockings, appears to have pitting edema.  Abdomen: no tenderness, no masses palpated. No hepatosplenomegaly. Bowel sounds positive.  Musculoskeletal: no clubbing / cyanosis.  No obvious joint deformity upper and lower extremities. Skin: no rashes, lesions, ulcers. No induration Neurologic: Moving all extremities spontaneously Psychiatric: Awake and alert but not responding verbally to questions  Labs on Admission: I have personally reviewed following labs and imaging studies  CBC: Recent Labs  Lab 06/08/2017 1434  WBC 12.2*  HGB 13.8  HCT 44.9  MCV 91.8  PLT 308   Basic Metabolic Panel: Recent Labs  Lab 05/29/2017 1434  NA 144  K 4.8  CL 110  CO2 20*  GLUCOSE 115*  BUN 56*  CREATININE 1.46*  CALCIUM 9.4   Cardiac Enzymes: Recent Labs  Lab 05/22/2017 1523  TROPONINI <0.03   Urine analysis:    Component Value Date/Time   COLORURINE YELLOW 05/10/2017 1234  APPEARANCEUR HAZY (A) 05/10/2017 1234   LABSPEC 1.015 05/10/2017 1234   PHURINE 6.0 05/10/2017 1234   GLUCOSEU NEGATIVE 05/10/2017 1234   HGBUR NEGATIVE 05/10/2017 1234   BILIRUBINUR NEGATIVE 05/10/2017 1234   KETONESUR NEGATIVE 05/10/2017 1234   PROTEINUR NEGATIVE 05/10/2017 1234   UROBILINOGEN 1.0 08/22/2009 1813   NITRITE NEGATIVE 05/10/2017 1234   LEUKOCYTESUR MODERATE (A) 05/10/2017 1234    Radiological Exams on Admission: Dg Chest Portable 1 View  Result Date: 10/06/2016 CLINICAL DATA:  Tachycardia short of breath EXAM: PORTABLE CHEST 1 VIEW COMPARISON:  02/02/2017 FINDINGS: Diffuse bilateral airspace disease has developed since the prior study most compatible with edema. Right lower lobe atelectasis and right effusion. Mild left lower lobe atelectasis. Mild cardiac enlargement IMPRESSION: Diffuse bilateral airspace disease most consistent with congestive heart failure. Associated right pleural effusion and right lower lobe atelectasis. Electronically Signed   By: Marlan Palauharles  Clark M.D.   On: 10/06/2016 15:37    EKG: Independently reviewed.  Atrial fibrillation. HR- 153.  Assessment/Plan Principal Problem:   Atrial fibrillation with RVR (HCC) Active Problems:   Seizure  (HCC)  Atrial fibrillation with RVR- rates- 150s on admission down to 110s on Cardizem drip.  Initial hypotension-down to 81/59, likely from RVR improved with 1 L normal saline.  Patient not on anticoagulation.  TSH normal. -Phone consult with in-house cardiologist on call at [facility], Dr. Cristina GongVedre, who recommended overnight, if pulse is greater than 120 and systolic drops below 100 can switch patient to IV amiodarone.  Both will need to give bolus heparin recommended 4000 units and continue with heparin drip for anticoagulation before starting IV amiodarone, consult cardiology in the a.m. -Cardiology okay with continuing home amiodarone 200 mg daily, while on cardizem drip -For now will continue IV Cardizem, unknown EF -Placed cardiology consult, recs appreciated in advance -Trend troponins -Echocardiogram  Acute on chronic CHF exacerbation-likely aggravated by 1 L normal saline.  Initially required BiPAP, now off on nasal cannula.  No echo on file. Chest xray shows CHF. -Check BNP-elevated 799. -Echocardiogram -Iv lasix 40mg  given, cautious diuresis with hypotension. - BMP am -Daily weights strict input output - BIPAP as needed - NPO while on BIpap  Body aches, hypotension, cough- no reported fever. Chest Xray- CHF, right pelural effusion, right LL athelectasis. WBC - 12.2. -Flu check negative -Follow blood culture results - CBC am  Seizure disorder-new home Keppra  Dementia-  - cont home donepezil - swallow eval  DVT prophylaxis: Lovenox Code Status: DNR  Family Communication: Pts Surrogate decision maker Disposition Plan: To be determined  Consults called: Cardiology consult  Admission status: Inpt, Step down   Onnie BoerEjiroghene E Mandie Crabbe MD [facility] Pager [phone]  If 11PM-7AM, please contact night-coverage [email or web address] Password [email or web address]  10/06/2016, 7:58 PM

## 2017-06-18 NOTE — ED Notes (Signed)
Family at member at bedside.

## 2017-06-18 NOTE — ED Triage Notes (Signed)
[facility] and staff reports pt has had cough, generalized body aches, and decreased appetite x 2 days.  Staff says pt c/o having difficulty swallowing.

## 2017-06-18 NOTE — ED Notes (Signed)
RT in room.  Pt has bi pap on at present time.  Per RT pt holaring out that she can not breathe.

## 2017-06-18 NOTE — ED Notes (Signed)
Phoned Dr Antionette Charpyd to inform pt with increasing 02 requirements RRT placing pt on CPAP.  Pt with frothy sputum noted.  JVD to mandible.  Chest xray noted with fluid overload.  MD placing orders for Lasix and foley catheter.

## 2017-06-18 NOTE — ED Provider Notes (Signed)
[facility] EMERGENCY DEPARTMENT Provider Note   CSN: [id number] Arrival date & time: Apr 26, 2017  1352     History   Chief Complaint Chief Complaint  Patient presents with  . Cough  . Generalized Body Aches    HPI [name] [name] is a [age] y.o. female.  Patient presents by EMS from her nursing care facility for swelling in legs, sore throat, and cough.  She is unable to give any history.  She seems hard of hearing.  Level 5 caveat-dementia   HPI  Past Medical History:  Diagnosis Date  . Alzheimer's dementia   . Anemia    Gastritis/duodenitis  . Atrial fibrillation (HCC)   . Dementia   . Diverticulosis   . Dyslipidemia   . GERD (gastroesophageal reflux disease)   . Hx of colonic polyps 2010   2010: Colonoscopy and polypectomy; diverticula also noted  . Hyperlipidemia   . Hypothyroid   . Mental retardation   . Osteoarthritis   . Pneumonia 01/1999 and  . Protein calorie malnutrition (HCC)   . Protein calorie malnutrition (HCC)   . Seizure disorder [facility](HCC)     Patient Active Problem List   Diagnosis Date Noted  . Thrombocytopenia (HCC) 11/16/2012  . Conjunctivitis, allergic 11/16/2012  . Laboratory test 07/14/2012  . Hyperlipidemia 01/22/2011  . Anemia, normocytic normochromic-resolved 02/05/2009  . Atrial fibrillation (HCC) 02/05/2009  . SEIZURE DISORDER 02/05/2009    Past Surgical History:  Procedure Laterality Date  . CHOLECYSTECTOMY    . COLONOSCOPY W/ POLYPECTOMY  01/18/2009    simple adenomas/moderate diverticulosis, torturous colon, normal TI, vascular prominence near dentate line.  . ESOPHAGOGASTRODUODENOSCOPY  01/18/2009   Severe chronic gastritis, peptic duodenitis  . HEMORRHOID SURGERY      OB History    No data available       Home Medications    Prior to Admission medications   Medication Sig Start Date End Date Taking? Authorizing Provider  acetaminophen (TYLENOL) 325 MG tablet Take 325 mg by mouth every 6 (six) hours as needed for  mild pain or headache.   Yes [provider]  amiodarone (PACERONE) 200 MG tablet TAKE 1 TABLET BY MOUTH ONCE DAILY. 07/02/14  Yes Jodelle GrossLawrence, Kathryn M, NP  aspirin 81 MG EC tablet Take 81 mg by mouth every morning.    Yes [provider]  beta carotene w/minerals (OCUVITE) tablet Take 1 tablet by mouth daily.   Yes [provider]  Calcium Carbonate-Vit D-Min (CALTRATE 600+D PLUS) 600-400 MG-UNIT per tablet Take 1 tablet by mouth at bedtime.    Yes [provider]  diclofenac sodium (VOLTAREN) 1 % GEL Apply 2 g topically 2 (two) times daily. Apply 2g to right shoulder twice a day for pain   Yes [provider]  docusate sodium (COLACE) 100 MG capsule Take 100 mg by mouth 2 (two) times daily.    Yes [provider]  donepezil (ARICEPT) 5 MG tablet Take 5 mg by mouth at bedtime.    Yes [provider]  Epinastine HCl 0.05 % ophthalmic solution Place 1 drop into both eyes 2 (two) times daily as needed (allergic conjunctivitis). 11/16/12  Yes Jodelle GrossLawrence, Kathryn M, NP  iron polysaccharides (NIFEREX) 150 MG capsule Take 150 mg by mouth daily.    Yes [provider]  levETIRAcetam (KEPPRA) 500 MG tablet Take 500 mg by mouth 2 (two) times daily.     Yes [provider]  levothyroxine (SYNTHROID, LEVOTHROID) 75 MCG tablet Take 75 mcg  by mouth every morning.    Yes [provider]  meclizine (ANTIVERT) 25 MG tablet Take 12.5 mg by mouth 3 (three) times daily as needed for dizziness.    Yes [provider]  omeprazole (PRILOSEC) 20 MG capsule Take 20 mg by mouth 2 (two) times daily before a meal.    Yes [provider]  potassium chloride (K-DUR,KLOR-CON) 10 MEQ tablet Take 10 mEq by mouth daily.    Yes [provider]  QC HYDROGEN PEROXIDE EX Place 2 drops into the left ear 2 (two) times daily.   Yes [provider]  senna-docusate (SENOKOT-S) 8.6-50 MG tablet Take 1 tablet by mouth at  bedtime.   Yes [provider]  traMADol (ULTRAM) 50 MG tablet Take 50 mg by mouth 3 (three) times daily.   Yes [provider]  guaifenesin (ROBITUSSIN) 100 MG/5ML syrup Take 100 mg every 6 (six) hours as needed by mouth for cough.    [provider]    Family History No family history on file.  Social History Social History   Tobacco Use  . Smoking status: Never Smoker  . Smokeless tobacco: Never Used  . Tobacco comment: tobacco use - no  Substance Use Topics  . Alcohol use: No  . Drug use: No     Allergies   Patient has no known allergies.   Review of Systems Review of Systems  Unable to perform ROS: Dementia     Physical Exam Updated Vital Signs BP 97/68   Pulse (!) 124   Resp (!) 25   Wt 45.4 kg (100 lb)   SpO2 92%   BMI 19.53 kg/m   Physical Exam  Constitutional: She appears well-developed.  Frail, elderly  HENT:  Head: Normocephalic and atraumatic.  Eyes: Conjunctivae and EOM are normal. Pupils are equal, round, and reactive to light.  Neck: Normal range of motion and phonation normal. Neck supple.  Cardiovascular:  No murmur heard. Irregular tachycardia  Pulmonary/Chest: She is in respiratory distress. She exhibits no tenderness.  Tachypnea, using accessory muscles to breathe, pursed lipped breathing.  Abdominal: Soft. She exhibits no distension. There is no tenderness. There is no guarding.  Musculoskeletal: Normal range of motion. She exhibits edema. She exhibits no deformity.  Neurological: She is alert. She exhibits normal muscle tone.  Skin: Skin is warm and dry.  Psychiatric: She has a normal mood and affect. Her behavior is normal.  Nursing note and vitals reviewed.    ED Treatments / Results  Labs (all labs ordered are listed, but only abnormal results are displayed) Labs Reviewed  BASIC METABOLIC PANEL - Abnormal; Notable for the following components:      Result Value   CO2 20 (*)    Glucose, Bld 115 (*)     BUN 56 (*)    Creatinine, Ser 1.46 (*)    GFR calc non Af Amer 31 (*)    GFR calc Af Amer 36 (*)    All other components within normal limits  CBC - Abnormal; Notable for the following components:   WBC 12.2 (*)    All other components within normal limits  TROPONIN I    EKG  EKG Interpretation  Date/Time:  Friday 07/09/2017 14:21:23 EST Ventricular Rate:  153 PR Interval:    QRS Duration: 101 QT Interval:  328 QTC Calculation: 524 R Axis:   -79 Text Interpretation:  Atrial fibrillation with rapid V-rate Anterolateral infarct, age indeterminate Since last tracing rate  faster and Atrial Fibrillation is present Confirmed by Mancel BaleWentz, Zandyr Barnhill [phone]) on 12/04/2016 2:55:52 PM       CHA2DS2/VAS Stroke Risk Points      3 >= 2 Points: High Risk  1 - 1.99 Points: Medium Risk  0 Points: Low Risk    This is the only CHA2DS2/VAS Stroke Risk Points available for the past  year.:  Change: N/A     Details    This score determines the patient's risk of having a stroke if the  patient has atrial fibrillation.       Points Metrics  0 Has Congestive Heart Failure:  No   0 Has Vascular Disease:  No   0 Has Hypertension:  No   2 Age:  [age]   0 Has Diabetes:  No   0 Had Stroke:  No  Had TIA:  No  Had thromboembolism:  No   1 Female:  Yes     Decision made to not anticoagulate the patient in the past based on "frequent falls."      Radiology Dg Chest Portable 1 View  Result Date: 12/04/2016 CLINICAL DATA:  Tachycardia short of breath EXAM: PORTABLE CHEST 1 VIEW COMPARISON:  02/02/2017 FINDINGS: Diffuse bilateral airspace disease has developed since the prior study most compatible with edema. Right lower lobe atelectasis and right effusion. Mild left lower lobe atelectasis. Mild cardiac enlargement IMPRESSION: Diffuse bilateral airspace disease most consistent with congestive heart failure. Associated right pleural effusion and right lower lobe atelectasis. Electronically Signed    By: Marlan Palauharles  Clark M.D.   On: 12/04/2016 15:37    Procedures .Critical Care Performed by: Mancel BaleWentz, Lenda Baratta, MD Authorized by: Mancel BaleWentz, Rodriguez Aguinaldo, MD   Critical care provider statement:    Critical care time (minutes):  75   Critical care start time:  12/04/2016 2:48 PM   Critical care end time:  12/04/2016 5:08 PM   Critical care time was exclusive of:  Separately billable procedures and treating other patients   Critical care was necessary to treat or prevent imminent or life-threatening deterioration of the following conditions:  Cardiac failure   Critical care was time spent personally by me on the following activities:  Blood draw for specimens, evaluation of patient's response to treatment, examination of patient, obtaining history from patient or surrogate, ordering and performing treatments and interventions, ordering and review of laboratory studies, ordering and review of radiographic studies, pulse oximetry, re-evaluation of patient's condition and review of old charts   (including critical care time)  Medications Ordered in ED Medications  adenosine (ADENOCARD) 6 MG/2ML injection 6 mg (not administered)  adenosine (ADENOCARD) 6 MG/2ML injection (not administered)  metoprolol tartrate (LOPRESSOR) 5 MG/5ML injection (not administered)  diltiazem (CARDIZEM) 1 mg/mL load via infusion 5 mg (5 mg Intravenous Bolus from Bag 09-09-16 1612)    And  diltiazem (CARDIZEM) 100 mg in dextrose 5% 100mL (1 mg/mL) infusion (10 mg/hr Intravenous Rate/Dose Change 09-09-16 1655)  LORazepam (ATIVAN) injection 0.5 mg (not administered)  sodium chloride 0.9 % bolus 500 mL (0 mLs Intravenous Stopped 09-09-16 1504)  metoprolol tartrate (LOPRESSOR) injection 5 mg (2.5 mg Intravenous Given 09-09-16 1456)  sodium chloride 0.9 % bolus 500 mL (0 mLs Intravenous Stopped 09-09-16 1611)     Initial Impression / Assessment and Plan / ED Course  I have reviewed the triage vital signs and the nursing  notes.  Pertinent labs & imaging results that were available during my care of the patient were reviewed by me and considered  in my medical decision making (see chart for details).  Clinical Course as of Jun 18 1706  Fri 2017/06/27  1444 Considered Adenosine for SVT, but EKG is slightly irregular and rate around 150. Will try Lopressor for Atrial Flutter.  [EW]  1551 The patient's family member, [name] [name], has arrived.  She states that she is the only family member who helps the patient and typically she makes all the decisions for the patient.  She does not have a power of attorney, but the patient trusts her judgment and decisions.[name] [name] feels that the patient should not to be intubated, because that is her wish.  She does not have other specific wishes, about treatment.  GLENDA agrees to proceed with treatments such as BiPAP, and medications, to treat symptoms.  [EW]  1703 Currently on BiPAP, the patient is having discomfort from the fitting mask.  Respiratory status is improved on BiPAP.  Ativan ordered for tolerance of breathing device.   [EW]    Clinical Course User Index [EW] Mancel Bale, MD     Patient Vitals for the past 24 hrs:  BP Pulse Resp SpO2 Weight  2017-06-27 1653 97/68 (!) 124 (!) 25 92 % -  06-27-17 1600 (!) 105/57 (!) 128 (!) 25 (!) 86 % -  06-27-17 1554 (!) 86/58 - (!) 27 - -  06-27-2017 1551 96/84 - (!) 30 - -  06-27-2017 1548 103/82 - (!) 26 - -  06-27-2017 1545 (!) 88/59 - (!) 31 - -  06/27/17 1530 99/86 (!) 132 (!) 29 (!) 46 % -  Jun 27, 2017 1515 (!) 85/54 (!) 125 (!) 23 (!) 59 % -  Jun 27, 2017 1509 (!) 81/59 (!) 123 (!) 26 - -  2017-06-27 1506 (!) 82/73 (!) 132 (!) 23 - -  2017/06/27 1503 (!) 85/53 (!) 125 (!) 26 - -  June 27, 2017 1500 (!) 88/59 - (!) 41 - -  06/27/2017 1457 101/79 - (!) 39 - -  Jun 27, 2017 1455 94/83 62 (!) 24 90 % -  2017-06-27 1448 101/73 (!) 146 (!) 24 95 % -  27-Jun-2017 1445 95/74 72 (!) 28 (!) 88 % -  06-27-17 1437 - (!) 116 (!) 31 99 % -  06/27/2017 1421 (!) 87/73  (!) 155 (!) 29 - -  June 27, 2017 1403 - - - - 45.4 kg (100 lb)    5:07 PM Reevaluation with update and discussion. After initial assessment and treatment, an updated evaluation reveals she remains alert, and follows commands.  Family member updated on findings. Mancel Bale   5:10 PM-Consult complete with hospitalist. Patient case explained and discussed.  She agrees to admit patient for further evaluation and treatment. Call ended at 17: 55  Final Clinical Impressions(s) / ED Diagnoses   Final diagnoses:  Acute respiratory failure with hypoxia (HCC)  Atrial fibrillation with RVR (HCC)    Acute respiratory distress with fluid overload, rapid heartbeat, atrial fibrillation, respiratory distress, debilitated state and dementia.  Patient has been made DNR here in the emergency department.  She required aggressive treatment, and is marginally stabilized, for admission.  She remains on BiPAP, and rate control medication.  She may require gentle diuresis.  Is acutely ill and will need very close supervision in stepdown versus ICU.  Nursing Notes Reviewed/ Care Coordinated Applicable Imaging Reviewed Interpretation of Laboratory Data incorporated into ED treatment   Plan: Admit  ED Discharge Orders    None       Mancel Bale, MD 2017/06/27 1757

## 2017-06-18 NOTE — Progress Notes (Signed)
Had taken patient off BiPAP changed to high flow nasal cannula. Changed back to BiPAP at this time. Patient most likely needs Lasix as she appears to still had none. Chest x-ray shows pulm edema. Saturations on pulse ox are sketch at best. No catheter has been placed.

## 2017-06-18 NOTE — ED Notes (Signed)
Pt resting quietly on bi-pap.  No visible distress.

## 2017-06-18 NOTE — ED Triage Notes (Signed)
Pt reports feels like her throat is "stopped up" and reports vomiting.

## 2017-06-19 ENCOUNTER — Inpatient Hospital Stay (HOSPITAL_COMMUNITY): Payer: Medicare Other

## 2017-06-19 DIAGNOSIS — R569 Unspecified convulsions: Secondary | ICD-10-CM

## 2017-06-19 DIAGNOSIS — F0390 Unspecified dementia without behavioral disturbance: Secondary | ICD-10-CM | POA: Diagnosis present

## 2017-06-19 DIAGNOSIS — G309 Alzheimer's disease, unspecified: Secondary | ICD-10-CM

## 2017-06-19 DIAGNOSIS — F0280 Dementia in other diseases classified elsewhere without behavioral disturbance: Secondary | ICD-10-CM

## 2017-06-19 DIAGNOSIS — I509 Heart failure, unspecified: Secondary | ICD-10-CM

## 2017-06-19 DIAGNOSIS — J9621 Acute and chronic respiratory failure with hypoxia: Secondary | ICD-10-CM

## 2017-06-19 DIAGNOSIS — E039 Hypothyroidism, unspecified: Secondary | ICD-10-CM

## 2017-06-19 LAB — CBC
HCT: 42.3 % (ref 36.0–46.0)
HEMOGLOBIN: 12.8 g/dL (ref 12.0–15.0)
MCH: 28.3 pg (ref 26.0–34.0)
MCHC: 30.3 g/dL (ref 30.0–36.0)
MCV: 93.6 fL (ref 78.0–100.0)
PLATELETS: 269 10*3/uL (ref 150–400)
RBC: 4.52 MIL/uL (ref 3.87–5.11)
RDW: 15.2 % (ref 11.5–15.5)
WBC: 14.6 10*3/uL — AB (ref 4.0–10.5)

## 2017-06-19 LAB — TROPONIN I: Troponin I: <0.03 ng/mL (ref <0.03)

## 2017-06-19 LAB — BASIC METABOLIC PANEL
ANION GAP: 14 (ref 5–15)
BUN: 56 mg/dL — ABNORMAL HIGH (ref 6–20)
CHLORIDE: 112 mmol/L — AB (ref 101–111)
CO2: 18 mmol/L — AB (ref 22–32)
CREATININE: 1.45 mg/dL — AB (ref 0.44–1.00)
Calcium: 8.3 mg/dL — ABNORMAL LOW (ref 8.9–10.3)
GFR calc non Af Amer: 31 mL/min — ABNORMAL LOW (ref >60)
GFR, EST AFRICAN AMERICAN: 36 mL/min — AB (ref >60)
Glucose, Bld: 113 mg/dL — ABNORMAL HIGH (ref 65–99)
Potassium: 4 mmol/L (ref 3.5–5.1)
SODIUM: 144 mmol/L (ref 135–145)

## 2017-06-19 LAB — ECHOCARDIOGRAM COMPLETE
Height: 60 in
WEIGHTICAEL: 1280.43 [oz_av]

## 2017-06-19 LAB — MRSA PCR SCREENING: MRSA by PCR: NEGATIVE

## 2017-06-19 MED ORDER — ASPIRIN EC 81 MG PO TBEC: 81.0000 mg | DELAYED_RELEASE_TABLET | ORAL | Status: DC

## 2017-06-19 MED ORDER — LEVETIRACETAM IN NACL 500 MG/100ML IV SOLN
500.0000 mg | Freq: Two times a day (BID) | INTRAVENOUS | Status: DC
  Administered 2017-06-19 – 2017-06-21 (×4): 500 mg via INTRAVENOUS
  Filled 2017-06-19 (×5): qty 100

## 2017-06-19 MED ORDER — AMIODARONE LOAD VIA INFUSION
150.0000 mg | Freq: Once | INTRAVENOUS | Status: AC
  Administered 2017-06-19: 150 mg via INTRAVENOUS
  Filled 2017-06-19: qty 83.34

## 2017-06-19 MED ORDER — ENOXAPARIN SODIUM 30 MG/0.3ML ~~LOC~~ SOLN
30.0000 mg | SUBCUTANEOUS | Status: DC
  Administered 2017-06-19: 30 mg via SUBCUTANEOUS
  Filled 2017-06-19: qty 0.3

## 2017-06-19 MED ORDER — LEVOTHYROXINE SODIUM 75 MCG PO TABS: 75.0000 ug | ORAL_TABLET | ORAL | Status: DC

## 2017-06-19 MED ORDER — LORAZEPAM 2 MG/ML IJ SOLN
0.5000 mg | Freq: Once | INTRAMUSCULAR | Status: DC
  Filled 2017-06-19: qty 1

## 2017-06-19 MED ORDER — ORAL CARE MOUTH RINSE
15.0000 mL | Freq: Two times a day (BID) | OROMUCOSAL | Status: DC
  Administered 2017-06-19 (×2): 15 mL via OROMUCOSAL

## 2017-06-19 MED ORDER — AMIODARONE HCL IN DEXTROSE 360-4.14 MG/200ML-% IV SOLN
30.0000 mg/h | INTRAVENOUS | Status: DC
  Administered 2017-06-19 – 2017-06-20 (×2): 30 mg/h via INTRAVENOUS
  Filled 2017-06-19 (×2): qty 200

## 2017-06-19 MED ORDER — DONEPEZIL HCL 5 MG PO TABS: 5.0000 mg | ORAL_TABLET | Freq: Every day | ORAL | Status: DC

## 2017-06-19 MED ORDER — SENNOSIDES-DOCUSATE SODIUM 8.6-50 MG PO TABS: 1.0000 | ORAL_TABLET | Freq: Every day | ORAL | Status: DC

## 2017-06-19 MED ORDER — CHLORHEXIDINE GLUCONATE 0.12 % MT SOLN
15.0000 mL | Freq: Two times a day (BID) | OROMUCOSAL | Status: DC
  Administered 2017-06-19 – 2017-06-21 (×5): 15 mL via OROMUCOSAL
  Filled 2017-06-19 (×5): qty 15

## 2017-06-19 MED ORDER — AMIODARONE HCL IN DEXTROSE 360-4.14 MG/200ML-% IV SOLN
60.0000 mg/h | INTRAVENOUS | Status: AC
  Administered 2017-06-19 (×2): 60 mg/h via INTRAVENOUS
  Filled 2017-06-19: qty 200

## 2017-06-19 MED ORDER — GUAIFENESIN-DM 100-10 MG/5ML PO SYRP: 5.0000 mL | ORAL_SOLUTION | Freq: Three times a day (TID) | ORAL | Status: DC

## 2017-06-19 NOTE — Progress Notes (Signed)
PROGRESS NOTE    [name] GimenezFlorence A [name]  ZOX:[id number] DOB: 07/01/1927 DOA: 06/19/2017 PCP: [name], [name], MD    Brief Narrative:  83 year old female with a history of dementia, seizure disorder and atrial fibrillation, admitted to the hospital with decompensated CHF, respiratory failure and A. fib with RVR.  Currently on Cardizem infusion with borderline blood pressures.  Heart rate remains uncontrolled.  Amiodarone infusion started today.  Diurese as blood pressure will tolerate.   Assessment & Plan:   Principal Problem:   Atrial fibrillation with RVR (HCC) Active Problems:   Seizure (HCC)   Hypothyroidism   Dementia   Acute on chronic respiratory failure with hypoxia (HCC)   Acute CHF (congestive heart failure) (HCC)   1. Atrial fibrillation rapid ventricular response.  Heart rate remains uncontrolled.  Blood pressure is borderline.  She is currently on high-dose Cardizem infusion.  Would not add beta blockers at this time with borderline blood pressures.  Mild elevation of creatinine does not make digoxin ideal.  She is chronically on oral amiodarone.  We will try to load with IV amiodarone.  Review of prior cardiology notes indicate she is not a candidate for anticoagulation due to fall risk. 2. Acute CHF.  Echocardiogram has been ordered to assess EF.  Chest x-ray does show evidence of volume edema.  She also has peripheral edema.  Diuresis will be challenging with borderline blood pressures.  Hopefully, blood pressure should improve his heart rate is better controlled.  Continue with diuresis as tolerated.  Cardiac enzymes have been negative. 3. Acute respiratory failure.  Currently on BiPAP.  Related to CHF.  Trying to wean down oxygen as tolerated. 4. Hypothyroidism.  Continue Synthroid 5. Seizure disorder.  Continue on Keppra   DVT prophylaxis: lovenox Code Status: DNR Family Communication: no family present Disposition Plan: pending hospital course, may need a higher level of  care   Consultants:     Procedures:    Antimicrobials:      Subjective: Patient wakes up to voice. Seems agitated. Difficult to make out speech through bipap mask  Objective: Vitals:   06/19/17 0600 06/19/17 0615 06/19/17 0630 06/19/17 0645  BP: 94/71 (!) 89/66 (!) 88/74 101/72  Pulse: (!) 131 (!) 132 (!) 133 (!) 136  Resp: (!) 25 (!) 25 (!) 23 (!) 25  Temp:      TempSrc:      SpO2: 100% 97% 96% 100%  Weight:      Height:        Intake/Output Summary (Last 24 hours) at 06/19/2017 0902 Last data filed at 06/19/2017 0533 Gross per 24 hour  Intake 500 ml  Output 600 ml  Net -100 ml   Filed Weights   06/19/2017 1403 06/19/17 0240 06/19/17 0400  Weight: 45.4 kg (100 lb) 36.6 kg (80 lb 11 oz) 36.3 kg (80 lb 0.4 oz)    Examination:  General exam: Appears calm and comfortable, bipap in place  Respiratory system: crackles at bases. Respiratory effort normal. Cardiovascular system: S1 & S2 heard, irregular. No JVD, murmurs, rubs, gallops or clicks. 1+ pedal edema. Gastrointestinal system: Abdomen is nondistended, soft and nontender. No organomegaly or masses felt. Normal bowel sounds heard. Central nervous system:  No focal neurological deficits. Extremities: Symmetric 5 x 5 power. Skin: No rashes, lesions or ulcers Psychiatry: confused.     Data Reviewed: I have personally reviewed following labs and imaging studies  CBC: Recent Labs  Lab 06/17/2017 1434 06/19/17 0509  WBC 12.2* 14.6*  HGB  13.8 12.8  HCT 44.9 42.3  MCV 91.8 93.6  PLT 308 269   Basic Metabolic Panel: Recent Labs  Lab June 08, 2017 1434 06/19/17 0509  NA 144 144  K 4.8 4.0  CL 110 112*  CO2 20* 18*  GLUCOSE 115* 113*  BUN 56* 56*  CREATININE 1.46* 1.45*  CALCIUM 9.4 8.3*   GFR: Estimated Creatinine Clearance: 15.7 mL/min (A) (by C-G formula based on SCr of 1.45 mg/dL (H)). Liver Function Tests: No results for input(s): AST, ALT, ALKPHOS, BILITOT, PROT, ALBUMIN in the last 168  hours. No results for input(s): LIPASE, AMYLASE in the last 168 hours. No results for input(s): AMMONIA in the last 168 hours. Coagulation Profile: No results for input(s): INR, PROTIME in the last 168 hours. Cardiac Enzymes: Recent Labs  Lab June 08, 2017 1523 June 08, 2017 2054 06/19/17 0509  TROPONINI <0.03 <0.03 <0.03   BNP (last 3 results) No results for input(s): PROBNP in the last 8760 hours. HbA1C: No results for input(s): HGBA1C in the last 72 hours. CBG: No results for input(s): GLUCAP in the last 168 hours. Lipid Profile: No results for input(s): CHOL, HDL, LDLCALC, TRIG, CHOLHDL, LDLDIRECT in the last 72 hours. Thyroid Function Tests: Recent Labs    June 08, 2017 1434  TSH 2.764   Anemia Panel: No results for input(s): VITAMINB12, FOLATE, FERRITIN, TIBC, IRON, RETICCTPCT in the last 72 hours. Sepsis Labs: No results for input(s): PROCALCITON, LATICACIDVEN in the last 168 hours.  Recent Results (from the past 240 hour(s))  Culture, blood (routine x 2)     Status: None (Preliminary result)   Collection Time: June 08, 2017  8:49 PM  Result Value Ref Range Status   Specimen Description BLOOD RIGHT ARM  Final   Special Requests   Final    BOTTLES DRAWN AEROBIC AND ANAEROBIC Blood Culture adequate volume   Culture PENDING  Incomplete   Report Status PENDING  Incomplete  Culture, blood (routine x 2)     Status: None (Preliminary result)   Collection Time: June 08, 2017  8:56 PM  Result Value Ref Range Status   Specimen Description RIGHT ANTECUBITAL  Final   Special Requests   Final    BOTTLES DRAWN AEROBIC ONLY Blood Culture results may not be optimal due to an inadequate volume of blood received in culture bottles   Culture PENDING  Incomplete   Report Status PENDING  Incomplete         Radiology Studies: Dg Chest Portable 1 View  Result Date: Nov 03, 2016 CLINICAL DATA:  Tachycardia short of breath EXAM: PORTABLE CHEST 1 VIEW COMPARISON:  02/02/2017 FINDINGS: Diffuse bilateral  airspace disease has developed since the prior study most compatible with edema. Right lower lobe atelectasis and right effusion. Mild left lower lobe atelectasis. Mild cardiac enlargement IMPRESSION: Diffuse bilateral airspace disease most consistent with congestive heart failure. Associated right pleural effusion and right lower lobe atelectasis. Electronically Signed   By: Marlan Palauharles  Clark M.D.   On: Nov 03, 2016 15:37        Scheduled Meds: . amiodarone  150 mg Intravenous Once  . aspirin EC  81 mg Oral BH-q7a  . chlorhexidine  15 mL Mouth Rinse BID  . donepezil  5 mg Oral QHS  . enoxaparin (LOVENOX) injection  30 mg Subcutaneous Q24H  . levETIRAcetam  500 mg Oral BID  . levothyroxine  75 mcg Oral BH-q7a  . mouth rinse  15 mL Mouth Rinse q12n4p  . senna-docusate  1 tablet Oral QHS   Continuous Infusions: . amiodarone  Followed by  . amiodarone    . diltiazem (CARDIZEM) infusion 12.5 mg/hr (06/19/17 0408)     LOS: 1 day    Time spent:    Erick Blinks, MD [facility] Pager [phone]  If 7PM-7AM, please contact night-coverage [email or web address] Password [email or web address] 06/19/2017, 9:02 AM

## 2017-06-19 NOTE — Progress Notes (Signed)
  Echocardiogram 2D Echocardiogram has been performed.  [name] [name], [name] [name] 06/19/2017, 10:54 AM

## 2017-06-19 NOTE — Progress Notes (Signed)
Patient on BiPAP urinated 350 cc.

## 2017-06-19 NOTE — Plan of Care (Signed)
Some plans progressing. Still very drowsy and understanding of problems will improve with mental status improvement.

## 2017-06-19 NOTE — Progress Notes (Signed)
SLP Cancellation Note  Patient Details Name: [name] GimenezFlorence A [name] MRN: [id number] DOB: 04/12/1928   Cancelled treatment:       Reason Eval/Treat Not Completed: Medical issues which prohibited therapy. Pt is currently on BiPAP and is not appropriate for PO trials. ST will re-attempt as schedule permits and pt becomes appropriate for eval.  Amelia H. Romie LeveeYarbrough MA, CCC-SLP Speech Language Pathologist    [name] 06/19/2017, 10:59 AM

## 2017-06-20 ENCOUNTER — Other Ambulatory Visit: Payer: Self-pay

## 2017-06-20 DIAGNOSIS — J9601 Acute respiratory failure with hypoxia: Secondary | ICD-10-CM

## 2017-06-20 DIAGNOSIS — I5031 Acute diastolic (congestive) heart failure: Secondary | ICD-10-CM

## 2017-06-20 LAB — BLOOD CULTURE ID PANEL (REFLEXED)
ACINETOBACTER BAUMANNII: NOT DETECTED
CANDIDA ALBICANS: NOT DETECTED
CANDIDA KRUSEI: NOT DETECTED
CANDIDA PARAPSILOSIS: NOT DETECTED
CANDIDA TROPICALIS: NOT DETECTED
Candida glabrata: NOT DETECTED
ESCHERICHIA COLI: NOT DETECTED
Enterobacter cloacae complex: NOT DETECTED
Enterobacteriaceae species: NOT DETECTED
Enterococcus species: NOT DETECTED
Haemophilus influenzae: NOT DETECTED
KLEBSIELLA OXYTOCA: NOT DETECTED
KLEBSIELLA PNEUMONIAE: NOT DETECTED
Listeria monocytogenes: NOT DETECTED
Methicillin resistance: NOT DETECTED
Neisseria meningitidis: NOT DETECTED
PSEUDOMONAS AERUGINOSA: NOT DETECTED
Proteus species: NOT DETECTED
SERRATIA MARCESCENS: NOT DETECTED
STAPHYLOCOCCUS AUREUS BCID: NOT DETECTED
STREPTOCOCCUS PNEUMONIAE: NOT DETECTED
STREPTOCOCCUS PYOGENES: NOT DETECTED
Staphylococcus species: DETECTED — AB
Streptococcus agalactiae: NOT DETECTED
Streptococcus species: NOT DETECTED

## 2017-06-20 MED ORDER — MORPHINE SULFATE (PF) 2 MG/ML IV SOLN
1.0000 mg | INTRAVENOUS | Status: DC | PRN
Start: 2017-06-20 — End: 2017-06-21
  Administered 2017-06-20 – 2017-06-21 (×5): 1 mg via INTRAVENOUS
  Filled 2017-06-20 (×5): qty 1

## 2017-06-20 MED ORDER — LORAZEPAM 2 MG/ML IJ SOLN
1.0000 mg | INTRAMUSCULAR | Status: DC | PRN
  Administered 2017-06-20 – 2017-06-21 (×3): 1 mg via INTRAVENOUS
  Filled 2017-06-20 (×3): qty 1

## 2017-06-20 NOTE — Plan of Care (Signed)
Altered mental status and medical condition resulting in end of life care.

## 2017-06-20 NOTE — Progress Notes (Signed)
Discussed in detail with next of kin Ms. Glenda Cardwell.  We have decided to transition to comfort care.  Will take off IV Cardizem and amiodarone, will place orders for as needed morphine and Ativan.  May transition to morphine drip if needed.  Peggye PittEstela Hernandez, MD [facility] Pager: [phone]

## 2017-06-20 NOTE — Progress Notes (Signed)
Called report and gave report to 300 RN who will be getting [name] [name]. Taken to 300 by bed from 300

## 2017-06-20 NOTE — Progress Notes (Addendum)
PROGRESS NOTE    [name] GimenezFlorence A Murrillo  ZOX:[id number] DOB: 01/10/1928 DOA: 06/16/2017 PCP: [name], [name], MD     Brief Narrative:  83 year old woman admitted from home on 12/28 due to shortness of breath.  She has a history of dementia, seizure disorder and atrial fibrillation.  She was found to have decompensated heart failure, acute respiratory failure as well as A. fib with RVR.  As of today she remains on Cardizem infusion as well as amiodarone infusion, heart rate remains in the 130s and her systolic blood pressure has been 70s-80s.   Assessment & Plan:   Principal Problem:   Atrial fibrillation with RVR (HCC) Active Problems:   Seizure (HCC)   Hypothyroidism   Dementia   Acute on chronic respiratory failure with hypoxia (HCC)   Acute CHF (congestive heart failure) (HCC)  Paroxysmal Atrial fibrillation with rapid ventricular response -Treatment remains challenging due to systemic hypotension. -She is on a high dose Cardizem infusion as well as IV amiodarone with heart rates that remain in the 130s. -Creatinine is around 1.5 which does not make her an ideal candidate for digoxin. -Review of prior cardiology notes indicates she has not a candidate for anticoagulation due to fall risk. -Given she has not a candidate for anticoagulation, she would also not be a candidate for cardioversion. -Given all of the above, her frail chronic baseline state, her acute decompensation as well as her acute respiratory failure (remains on BiPAP with oxygen saturation in the 80s) I have called her next of kin to discuss goals of care.  She seems agreeable to transition to comfort care, however would like her son and his family to come in to see her before we transition her, she states this will happen over the next hour.  Acute diastolic heart failure -Likely is more tachycardia mediated.  No sign-Echo shows preserved ejection fraction and not technically sufficient to evaluate left ventricular  diastolic function. -Diuresis is challenging with low blood pressure.  Acute respiratory failure -Presumed related to congestive heart failure, currently on BiPAP with marginal oxygen saturations in the 80% range. -As above, likely transition to comfort care later this morning.  Hypothyroidism -Continue Synthroid  Seizure disorder -Continue Keppra, no active seizures while hospitalized so far.   DVT prophylaxis: Lovenox Code Status: DNR Family Communication: Discussed with next of kin via phone Disposition Plan: Likely transition to comfort care, if so anticipate hospital death  Consultants:   None  Procedures:   None  Antimicrobials:  Anti-infectives (From admission, onward)   None       Subjective: Lying in bed, restless, opens eyes to voice but otherwise no meaningful interaction.  Objective: Vitals:   06/20/17 0615 06/20/17 0630 06/20/17 0645 06/20/17 0823  BP: (!) 83/61 105/72 (!) 80/61 (!) 81/59  Pulse: (!) 127 (!) 144 (!) 125 (!) 125  Resp: (!) 26 (!) 25 (!) 25 (!) 26  Temp:    97.6 F (36.4 C)  TempSrc:    Axillary  SpO2: 100% 90% 98% 100%  Weight:      Height:        Intake/Output Summary (Last 24 hours) at 06/20/2017 1054 Last data filed at 06/20/2017 0600 Gross per 24 hour  Intake 622.09 ml  Output 375 ml  Net 247.09 ml   Filed Weights   06/19/17 0240 06/19/17 0400 06/20/17 0500  Weight: 36.6 kg (80 lb 11 oz) 36.3 kg (80 lb 0.4 oz) 38.3 kg (84 lb 7 oz)    Examination:  General exam: Cachectic, increased work of breathing, frail and acutely ill-appearing Respiratory system: Fair air movement, bilateral crackles Cardiovascular system: Tachycardic, irregular, rate is so fast that I am unable to appreciate any murmurs, rubs or gallops Gastrointestinal system: Abdomen is nondistended, soft and nontender. No organomegaly or masses felt. Normal bowel sounds heard. Central nervous system: Restless, no meaningful interaction, moves all 4  spontaneously Extremities: No C/C/E, +pedal 1+ pitting edema bilaterally Skin: No rashes, lesions or ulcers Psychiatry: Unable to assess given current mental state    Data Reviewed: I have personally reviewed following labs and imaging studies  CBC: Recent Labs  Lab Jul 17, 2017 1434 06/19/17 0509  WBC 12.2* 14.6*  HGB 13.8 12.8  HCT 44.9 42.3  MCV 91.8 93.6  PLT 308 269   Basic Metabolic Panel: Recent Labs  Lab 07/17/17 1434 06/19/17 0509  NA 144 144  K 4.8 4.0  CL 110 112*  CO2 20* 18*  GLUCOSE 115* 113*  BUN 56* 56*  CREATININE 1.46* 1.45*  CALCIUM 9.4 8.3*   GFR: Estimated Creatinine Clearance: 16.5 mL/min (A) (by C-G formula based on SCr of 1.45 mg/dL (H)). Liver Function Tests: No results for input(s): AST, ALT, ALKPHOS, BILITOT, PROT, ALBUMIN in the last 168 hours. No results for input(s): LIPASE, AMYLASE in the last 168 hours. No results for input(s): AMMONIA in the last 168 hours. Coagulation Profile: No results for input(s): INR, PROTIME in the last 168 hours. Cardiac Enzymes: Recent Labs  Lab 17-Jul-2017 1523 07-17-2017 2054 06/19/17 0509  TROPONINI <0.03 <0.03 <0.03   BNP (last 3 results) No results for input(s): PROBNP in the last 8760 hours. HbA1C: No results for input(s): HGBA1C in the last 72 hours. CBG: No results for input(s): GLUCAP in the last 168 hours. Lipid Profile: No results for input(s): CHOL, HDL, LDLCALC, TRIG, CHOLHDL, LDLDIRECT in the last 72 hours. Thyroid Function Tests: Recent Labs    17-Jul-2017 1434  TSH 2.764   Anemia Panel: No results for input(s): VITAMINB12, FOLATE, FERRITIN, TIBC, IRON, RETICCTPCT in the last 72 hours. Urine analysis:    Component Value Date/Time   COLORURINE YELLOW 05/10/2017 1234   APPEARANCEUR HAZY (A) 05/10/2017 1234   LABSPEC 1.015 05/10/2017 1234   PHURINE 6.0 05/10/2017 1234   GLUCOSEU NEGATIVE 05/10/2017 1234   HGBUR NEGATIVE 05/10/2017 1234   BILIRUBINUR NEGATIVE 05/10/2017 1234    KETONESUR NEGATIVE 05/10/2017 1234   PROTEINUR NEGATIVE 05/10/2017 1234   UROBILINOGEN 1.0 08/22/2009 1813   NITRITE NEGATIVE 05/10/2017 1234   LEUKOCYTESUR MODERATE (A) 05/10/2017 1234   Sepsis Labs: @LABRCNTIP (procalcitonin:4,lacticidven:4)  ) Recent Results (from the past 240 hour(s))  Culture, blood (routine x 2)     Status: None (Preliminary result)   Collection Time: July 17, 2017  8:49 PM  Result Value Ref Range Status   Specimen Description BLOOD RIGHT ARM  Final   Special Requests   Final    BOTTLES DRAWN AEROBIC AND ANAEROBIC Blood Culture adequate volume   Culture NO GROWTH 2 DAYS  Final   Report Status PENDING  Incomplete  Culture, blood (routine x 2)     Status: None (Preliminary result)   Collection Time: Jul 17, 2017  8:56 PM  Result Value Ref Range Status   Specimen Description RIGHT ANTECUBITAL  Final   Special Requests   Final    BOTTLES DRAWN AEROBIC ONLY Blood Culture results may not be optimal due to an inadequate volume of blood received in culture bottles   Culture NO GROWTH 2 DAYS  Final  Report Status PENDING  Incomplete  MRSA PCR Screening     Status: None   Collection Time: 06/19/17  2:29 AM  Result Value Ref Range Status   MRSA by PCR NEGATIVE NEGATIVE Final    Comment:        The GeneXpert MRSA Assay (FDA approved for NASAL specimens only), is one component of a comprehensive MRSA colonization surveillance program. It is not intended to diagnose MRSA infection nor to guide or monitor treatment for MRSA infections.          Radiology Studies: Dg Chest Portable 1 View  Result Date: Jul 12, 2016 CLINICAL DATA:  Tachycardia short of breath EXAM: PORTABLE CHEST 1 VIEW COMPARISON:  02/02/2017 FINDINGS: Diffuse bilateral airspace disease has developed since the prior study most compatible with edema. Right lower lobe atelectasis and right effusion. Mild left lower lobe atelectasis. Mild cardiac enlargement IMPRESSION: Diffuse bilateral airspace disease  most consistent with congestive heart failure. Associated right pleural effusion and right lower lobe atelectasis. Electronically Signed   By: Marlan Palauharles  Clark M.D.   On: Jul 12, 2016 15:37        Scheduled Meds: . aspirin EC  81 mg Oral BH-q7a  . chlorhexidine  15 mL Mouth Rinse BID  . donepezil  5 mg Oral QHS  . enoxaparin (LOVENOX) injection  30 mg Subcutaneous Q24H  . levothyroxine  75 mcg Oral BH-q7a  . LORazepam  0.5 mg Intravenous Once  . mouth rinse  15 mL Mouth Rinse q12n4p  . senna-docusate  1 tablet Oral QHS   Continuous Infusions: . amiodarone 30 mg/hr (06/20/17 0218)  . diltiazem (CARDIZEM) infusion 10 mg/hr (06/20/17 1008)  . levETIRAcetam 500 mg (06/20/17 0130)     LOS: 2 days    Time spent: 35 minutes. Greater than 50% of this time was spent in direct contact with the patient coordinating care.     Chaya JanEstela Hernandez Acosta, MD [facility] Pager [phone]  If 7PM-7AM, please contact night-coverage [email or web address] Password [email or web address] 06/20/2017, 10:54 AM

## 2017-06-21 ENCOUNTER — Encounter (HOSPITAL_COMMUNITY): Payer: Self-pay | Admitting: Primary Care

## 2017-06-21 DIAGNOSIS — Z7189 Other specified counseling: Secondary | ICD-10-CM

## 2017-06-21 DIAGNOSIS — Z515 Encounter for palliative care: Secondary | ICD-10-CM

## 2017-06-21 LAB — CULTURE, BLOOD (ROUTINE X 2): SPECIAL REQUESTS: ADEQUATE

## 2017-06-21 MED ORDER — MORPHINE SULFATE (CONCENTRATE) 10 MG/0.5ML PO SOLN
2.5000 mg | ORAL | Status: DC
  Administered 2017-06-21: 2.6 mg via ORAL
  Filled 2017-06-21: qty 0.5

## 2017-06-21 MED ORDER — MORPHINE SULFATE (CONCENTRATE) 10 MG/0.5ML PO SOLN
5.0000 mg | ORAL | Status: DC
  Filled 2017-06-21: qty 0.5

## 2017-06-21 MED ORDER — POLYVINYL ALCOHOL 1.4 % OP SOLN: 2.0000 [drp] | OPHTHALMIC | Status: DC | PRN

## 2017-06-21 MED ORDER — MORPHINE SULFATE (CONCENTRATE) 10 MG/0.5ML PO SOLN: 2.5000 mg | ORAL | Status: DC | PRN

## 2017-06-21 MED ORDER — POLYVINYL ALCOHOL 1.4 % OP SOLN
2.0000 [drp] | Freq: Four times a day (QID) | OPHTHALMIC | Status: DC
  Administered 2017-06-21: 2 [drp] via OPHTHALMIC
  Filled 2017-06-21: qty 15

## 2017-06-22 NOTE — Progress Notes (Signed)
PROGRESS NOTE    [name] GimenezFlorence A Okane  VQQ:[id number] DOB: 01/04/1928 DOA: 06/06/2017 PCP: [name], [name], MD     Brief Narrative:   84 year old woman admitted from home on 12/28 due to shortness of breath.  She has a history of dementia, seizure disorder and atrial fibrillation.  She was found to have decompensated heart failure, acute respiratory failure as well as A. fib with RVR.    She has since been transitioned to comfort care and I anticipate a hospital death.    Assessment & Plan:   Principal Problem:   Atrial fibrillation with RVR (HCC) Active Problems:   Seizure (HCC)   Hypothyroidism   Dementia   Acute on chronic respiratory failure with hypoxia (HCC)   Acute CHF (congestive heart failure) (HCC)   She has agonal respirations, I suspect death over the next few hours to days.  She is quite ill.  Appreciate palliative care recommendations.   DVT prophylaxis: None, on comfort care Code Status: DNR Family Communication: None Disposition Plan: Hospital death  Consultants:   Cardiology  Procedures:   None  Antimicrobials:  Anti-infectives (From admission, onward)   None       Subjective: Lying in bed, agonal respirations  Objective: Vitals:   06/20/17 1800 06/20/17 1900 04/13/2017 0435 04/13/2017 1215  BP:  (!) 75/48 (!) 87/51 (!) 90/57  Pulse: (!) 134 (!) 117 (!) 123   Resp: (!) 30 (!) 25 (!) 22 (!) 24  Temp:  97.8 F (36.6 C) 97.6 F (36.4 C)   TempSrc:  Axillary Axillary   SpO2: (!) 88% (!) 85% (!) 82%   Weight:   35.1 kg (77 lb 6.1 oz)   Height:        Intake/Output Summary (Last 24 hours) at 03/02/2017 1602 Last data filed at 03/02/2017 1200 Gross per 24 hour  Intake 100 ml  Output 600 ml  Net -500 ml   Filed Weights   06/19/17 0400 06/20/17 0500 04/13/2017 0435  Weight: 36.3 kg (80 lb 0.4 oz) 38.3 kg (84 lb 7 oz) 35.1 kg (77 lb 6.1 oz)    Examination:  Lying in bed, unresponsive, with agonal breathing.    Data Reviewed: I have  personally reviewed following labs and imaging studies  CBC: Recent Labs  Lab 06/20/2017 1434 06/19/17 0509  WBC 12.2* 14.6*  HGB 13.8 12.8  HCT 44.9 42.3  MCV 91.8 93.6  PLT 308 269   Basic Metabolic Panel: Recent Labs  Lab 06/05/2017 1434 06/19/17 0509  NA 144 144  K 4.8 4.0  CL 110 112*  CO2 20* 18*  GLUCOSE 115* 113*  BUN 56* 56*  CREATININE 1.46* 1.45*  CALCIUM 9.4 8.3*   GFR: Estimated Creatinine Clearance: 15.1 mL/min (A) (by C-G formula based on SCr of 1.45 mg/dL (H)). Liver Function Tests: No results for input(s): AST, ALT, ALKPHOS, BILITOT, PROT, ALBUMIN in the last 168 hours. No results for input(s): LIPASE, AMYLASE in the last 168 hours. No results for input(s): AMMONIA in the last 168 hours. Coagulation Profile: No results for input(s): INR, PROTIME in the last 168 hours. Cardiac Enzymes: Recent Labs  Lab 05/27/2017 1523 05/26/2017 2054 06/19/17 0509  TROPONINI <0.03 <0.03 <0.03   BNP (last 3 results) No results for input(s): PROBNP in the last 8760 hours. HbA1C: No results for input(s): HGBA1C in the last 72 hours. CBG: No results for input(s): GLUCAP in the last 168 hours. Lipid Profile: No results for input(s): CHOL, HDL, LDLCALC, TRIG, CHOLHDL, LDLDIRECT  in the last 72 hours. Thyroid Function Tests: No results for input(s): TSH, T4TOTAL, FREET4, T3FREE, THYROIDAB in the last 72 hours. Anemia Panel: No results for input(s): VITAMINB12, FOLATE, FERRITIN, TIBC, IRON, RETICCTPCT in the last 72 hours. Urine analysis:    Component Value Date/Time   COLORURINE YELLOW 05/10/2017 1234   APPEARANCEUR HAZY (A) 05/10/2017 1234   LABSPEC 1.015 05/10/2017 1234   PHURINE 6.0 05/10/2017 1234   GLUCOSEU NEGATIVE 05/10/2017 1234   HGBUR NEGATIVE 05/10/2017 1234   BILIRUBINUR NEGATIVE 05/10/2017 1234   KETONESUR NEGATIVE 05/10/2017 1234   PROTEINUR NEGATIVE 05/10/2017 1234   UROBILINOGEN 1.0 08/22/2009 1813   NITRITE NEGATIVE 05/10/2017 1234   LEUKOCYTESUR  MODERATE (A) 05/10/2017 1234   Sepsis Labs: @LABRCNTIP (procalcitonin:4,lacticidven:4)  ) Recent Results (from the past 240 hour(s))  Culture, blood (routine x 2)     Status: Abnormal   Collection Time: 06/10/2017  8:49 PM  Result Value Ref Range Status   Specimen Description BLOOD RIGHT ARM  Final   Special Requests   Final    BOTTLES DRAWN AEROBIC AND ANAEROBIC Blood Culture adequate volume   Culture  Setup Time   Final    GRAM POSITIVE COCCI AEROBIC BOTTLE ONLY Gram Stain Report Called to,Read Back By and Verified With: MOTLEY,J@1130  BY MATTHEWS, B 12.30.18 Performed at [facility] CRITICAL RESULT CALLED TO, READ BACK BY AND VERIFIED WITH: L HYLTON,RN AT 1624 06/20/17 BY L BENFIELD    Culture (A)  Final    STAPHYLOCOCCUS SPECIES (COAGULASE NEGATIVE) THE SIGNIFICANCE OF ISOLATING THIS ORGANISM FROM A SINGLE SET OF BLOOD CULTURES WHEN MULTIPLE SETS ARE DRAWN IS UNCERTAIN. PLEASE NOTIFY THE MICROBIOLOGY DEPARTMENT WITHIN ONE WEEK IF SPECIATION AND SENSITIVITIES ARE REQUIRED. Performed at [facility], [address] [place], [place] [address]    Report Status 2016-07-10 FINAL  Final  Blood Culture ID Panel (Reflexed)     Status: Abnormal   Collection Time: 05/31/2017  8:49 PM  Result Value Ref Range Status   Enterococcus species NOT DETECTED NOT DETECTED Final   Listeria monocytogenes NOT DETECTED NOT DETECTED Final   Staphylococcus species DETECTED (A) NOT DETECTED Final    Comment: Methicillin (oxacillin) susceptible coagulase negative staphylococcus. Possible blood culture contaminant (unless isolated from more than one blood culture draw or clinical case suggests pathogenicity). No antibiotic treatment is indicated for blood  culture contaminants. CRITICAL RESULT CALLED TO, READ BACK BY AND VERIFIED WITH: L HYLTON,RN AT 1624 06/20/17 BY L BENFIELD    Staphylococcus aureus NOT DETECTED NOT DETECTED Final   Methicillin resistance NOT DETECTED NOT DETECTED Final    Streptococcus species NOT DETECTED NOT DETECTED Final   Streptococcus agalactiae NOT DETECTED NOT DETECTED Final   Streptococcus pneumoniae NOT DETECTED NOT DETECTED Final   Streptococcus pyogenes NOT DETECTED NOT DETECTED Final   Acinetobacter baumannii NOT DETECTED NOT DETECTED Final   Enterobacteriaceae species NOT DETECTED NOT DETECTED Final   Enterobacter cloacae complex NOT DETECTED NOT DETECTED Final   Escherichia coli NOT DETECTED NOT DETECTED Final   Klebsiella oxytoca NOT DETECTED NOT DETECTED Final   Klebsiella pneumoniae NOT DETECTED NOT DETECTED Final   Proteus species NOT DETECTED NOT DETECTED Final   Serratia marcescens NOT DETECTED NOT DETECTED Final   Haemophilus influenzae NOT DETECTED NOT DETECTED Final   Neisseria meningitidis NOT DETECTED NOT DETECTED Final   Pseudomonas aeruginosa NOT DETECTED NOT DETECTED Final   Candida albicans NOT DETECTED NOT DETECTED Final   Candida glabrata NOT DETECTED NOT DETECTED Final  Candida krusei NOT DETECTED NOT DETECTED Final   Candida parapsilosis NOT DETECTED NOT DETECTED Final   Candida tropicalis NOT DETECTED NOT DETECTED Final    Comment: Performed at [facility], [address] [place], [place] [address]  Culture, blood (routine x 2)     Status: None (Preliminary result)   Collection Time: 06/12/2017  8:56 PM  Result Value Ref Range Status   Specimen Description RIGHT ANTECUBITAL  Final   Special Requests   Final    BOTTLES DRAWN AEROBIC ONLY Blood Culture results may not be optimal due to an inadequate volume of blood received in culture bottles   Culture NO GROWTH 3 DAYS  Final   Report Status PENDING  Incomplete  MRSA PCR Screening     Status: None   Collection Time: 06/19/17  2:29 AM  Result Value Ref Range Status   MRSA by PCR NEGATIVE NEGATIVE Final    Comment:        The GeneXpert MRSA Assay (FDA approved for NASAL specimens only), is one component of a comprehensive MRSA colonization surveillance  program. It is not intended to diagnose MRSA infection nor to guide or monitor treatment for MRSA infections.          Radiology Studies: No results found.      Scheduled Meds: . morphine CONCENTRATE  5 mg Oral Q4H  . polyvinyl alcohol  2 drop Both Eyes QID   Continuous Infusions: . levETIRAcetam Stopped (06/24/2017 1020)     LOS: 3 days    Time spent: 15 minutes. Greater than 50% of this time was spent in direct contact with the patient coordinating care.     Chaya Jan, MD [facility] Pager [phone]  If 7PM-7AM, please contact night-coverage [email or web address] Password [email or web address] Jun 24, 2017, 4:02 PM

## 2017-06-22 NOTE — Discharge Summary (Signed)
Death summary  Patient was an 84 year old woman who was admitted to the hospital on 12/28 due to shortness of breath.  Found to have decompensated heart failure and required BiPAP.  She then developed atrial fibrillation with rapid ventricular response, heart rate was not controlled on both Cardizem and amiodarone drips.  She became unresponsive and after discussion with family members decision was made to transition over to comfort care.  She subsequently expired on June 21, 2017.  Causes of death -Acute hypoxemic respiratory failure -Acute on chronic diastolic heart failure -Atrial fibrillation with rapid ventricular response -Seizure disorder  Peggye PittEstela Hernandez, MD [facility] Pager: [phone]

## 2017-06-22 NOTE — Consult Note (Signed)
Consultation Note Date: 07/04/2017   Patient Name: [name] [name]  DOB: [birth year]  MRN: [id number]  Age / Sex: 84 y.o., female  PCP: Benita Stabile, MD Referring Physician: Philip Aspen, Minerva Ends*  Reason for Consultation: Establishing goals of care, Non pain symptom management, Psychosocial/spiritual support and Terminal Care  HPI/Patient Profile: 84 y.o. female  with past medical history of atrial fibrillation with RVR, dementia, acute on chronic respiratory failure, admitted on 06/20/2017 with atrial fibrillation with RVR.   Clinical Assessment and Goals of Care: [name] [name] is resting quietly in bed.  She does not open her eyes when I gently touch her arm or call her name.  She appears relatively comfortable.  Present today at bedside is next of kin, [name] [name].  She states that she feels [name] [name] is comfortable, no additional needs at this time. Conference with nursing staff related to symptom management. Conference with Dr. Ardyth Harps related to symptom management. Anticipate in hospital death, hours to days.  Healthcare power of attorney NEXT OF KIN  - next of kin [name].  SUMMARY OF RECOMMENDATIONS   Full comfort measures  Code Status/Advance Care Planning:  DNR  Symptom Management:   Per hospitalist, anxiety and pain medication scheduled per palliative.  Palliative Prophylaxis:   Eye Care and Turn Reposition  Additional Recommendations (Limitations, Scope, Preferences):  Full Comfort Care  Psycho-social/Spiritual:   Desire for further Chaplaincy support:no  Additional Recommendations: Caregiving  Support/Resources  Prognosis:   Hours - Days  Discharge Planning: Anticipated Hospital Death      Primary Diagnoses: Present on Admission: . Atrial fibrillation with RVR (HCC) . Hypothyroidism . Dementia . Acute on chronic respiratory failure with  hypoxia (HCC)   I have reviewed the medical record, interviewed the patient and family, and examined the patient. The following aspects are pertinent.  Past Medical History:  Diagnosis Date  . Alzheimer's dementia   . Anemia    Gastritis/duodenitis  . Atrial fibrillation (HCC)   . Dementia   . Diverticulosis   . Dyslipidemia   . GERD (gastroesophageal reflux disease)   . Hx of colonic polyps 2010   2010: Colonoscopy and polypectomy; diverticula also noted  . Hyperlipidemia   . Hypothyroid   . Mental retardation   . Osteoarthritis   . Pneumonia 01/1999 and  . Protein calorie malnutrition (HCC)   . Protein calorie malnutrition (HCC)   . Seizure disorder [facility])    Social History   Socioeconomic History  . Marital status: Single    Spouse name: None  . Number of children: None  . Years of education: None  . Highest education level: None  Social Needs  . Financial resource strain: None  . Food insecurity - worry: None  . Food insecurity - inability: None  . Transportation needs - medical: None  . Transportation needs - non-medical: None  Occupational History  . Occupation: Disabled  Tobacco Use  . Smoking status: Never Smoker  . Smokeless tobacco: Never Used  . Tobacco comment: tobacco use -  no  Substance and Sexual Activity  . Alcohol use: No  . Drug use: No  . Sexual activity: None  Other Topics Concern  . None  Social History Narrative  . None   History reviewed. No pertinent family history. Scheduled Meds: . aspirin EC  81 mg Oral [id number]  . chlorhexidine  15 mL Mouth Rinse BID   Continuous Infusions: . levETIRAcetam Stopped (05/28/2017 1020)   PRN Meds:.LORazepam, morphine injection Medications Prior to Admission:  Prior to Admission medications   Medication Sig Start Date End Date Taking? Authorizing Provider  acetaminophen (TYLENOL) 325 MG tablet Take 325 mg by mouth every 6 (six) hours as needed for mild pain or headache.   Yes [provider]   amiodarone (PACERONE) 200 MG tablet TAKE 1 TABLET BY MOUTH ONCE DAILY. 07/02/14  Yes Jodelle GrossLawrence, Kathryn M, NP  aspirin 81 MG EC tablet Take 81 mg by mouth every morning.    Yes [provider]  beta carotene w/minerals (OCUVITE) tablet Take 1 tablet by mouth daily.   Yes [provider]  Calcium Carbonate-Vit D-Min (CALTRATE 600+D PLUS) 600-400 MG-UNIT per tablet Take 1 tablet by mouth at bedtime.    Yes [provider]  diclofenac sodium (VOLTAREN) 1 % GEL Apply 2 g topically 2 (two) times daily. Apply 2g to right shoulder twice a day for pain   Yes [provider]  docusate sodium (COLACE) 100 MG capsule Take 100 mg by mouth 2 (two) times daily.    Yes [provider]  donepezil (ARICEPT) 5 MG tablet Take 5 mg by mouth at bedtime.    Yes [provider]  Epinastine HCl 0.05 % ophthalmic solution Place 1 drop into both eyes 2 (two) times daily as needed (allergic conjunctivitis). 11/16/12  Yes Jodelle GrossLawrence, Kathryn M, NP  iron polysaccharides (NIFEREX) 150 MG capsule Take 150 mg by mouth daily.    Yes [provider]  levETIRAcetam (KEPPRA) 500 MG tablet Take 500 mg by mouth 2 (two) times daily.     Yes [provider]  levothyroxine (SYNTHROID, LEVOTHROID) 75 MCG tablet Take 75 mcg by mouth every morning.    Yes [provider]  meclizine (ANTIVERT) 25 MG tablet Take 12.5 mg by mouth 3 (three) times daily as needed for dizziness.    Yes [provider]  omeprazole (PRILOSEC) 20 MG capsule Take 20 mg by mouth 2 (two) times daily before a meal.    Yes [provider]  potassium chloride (K-DUR,KLOR-CON) 10 MEQ tablet Take 10 mEq by mouth daily.    Yes [provider]  QC HYDROGEN PEROXIDE EX Place 2 drops into the left ear 2 (two) times daily.   Yes [provider]  senna-docusate (SENOKOT-S) 8.6-50 MG tablet Take 1 tablet by mouth at bedtime.   Yes [provider]  traMADol  (ULTRAM) 50 MG tablet Take 50 mg by mouth 3 (three) times daily.   Yes [provider]  guaifenesin (ROBITUSSIN) 100 MG/5ML syrup Take 100 mg every 6 (six) hours as needed by mouth for cough.    [provider]   No Known Allergies Review of Systems  Unable to perform ROS: Acuity of condition    Physical Exam  Constitutional: No distress.  Appears to be actively dying  HENT:  Head: Atraumatic.  Temporal wasting  Pulmonary/Chest: Effort normal. No respiratory distress.  Abdominal: Soft. She exhibits no distension.  Musculoskeletal: She exhibits no edema.  Muscle wasting  Neurological:  Does  not respond to voice or touch  Skin: Skin is warm and dry.  Nursing note and vitals reviewed.   Vital Signs: BP (!) 87/51 (BP Location: Left Arm)   Pulse (!) 123   Temp 97.6 F (36.4 C) (Axillary)   Resp (!) 22   Ht 5' (1.524 m)   Wt 35.1 kg (77 lb 6.1 oz)   SpO2 (!) 82%   BMI 15.11 kg/m  Pain Assessment: PAINAD POSS *See Group Information*: (being made comfortable end of life) Pain Score: Asleep   SpO2: SpO2: (!) 82 % O2 Device:SpO2: (!) 82 % O2 Flow Rate: .O2 Flow Rate (L/min): 4 L/min  IO: Intake/output summary:   Intake/Output Summary (Last 24 hours) at 2017-02-15 1159 Last data filed at 2017-02-15 0800 Gross per 24 hour  Intake 405.23 ml  Output 600 ml  Net -194.77 ml    LBM: Last BM Date: (unknown) Baseline Weight: Weight: 45.4 kg (100 lb) Most recent weight: Weight: 35.1 kg (77 lb 6.1 oz)     Palliative Assessment/Data:   Flowsheet Rows     Most Recent Value  Intake Tab  Referral Department  Hospitalist  Unit at Time of Referral  Med/Surg Unit  Palliative Care Primary Diagnosis  Cardiac  Date Notified  06/20/17  Palliative Care Type  New Palliative care  Reason for referral  End of Life Care Assistance, Psychosocial or Spiritual support, Clarify Goals of Care  Date of Admission  05/24/2017  Date first seen by Palliative Care  07/17/16  #  of days Palliative referral response time  1 Day(s)  # of days IP prior to Palliative referral  2  Clinical Assessment  Palliative Performance Scale Score  10%  Pain Max last 24 hours  Not able to report  Pain Min Last 24 hours  Not able to report  Dyspnea Max Last 24 Hours  Not able to report  Dyspnea Min Last 24 hours  Not able to report  Psychosocial & Spiritual Assessment  Palliative Care Outcomes  Patient/Family meeting held?  Yes  Who was at the meeting?  Caregiver [name] at bedside  Palliative Care Outcomes  Clarified goals of care, Provided end of life care assistance, Provided psychosocial or spiritual support  Patient/Family wishes: Interventions discontinued/not started   Mechanical Ventilation      Time In: 1415 Time Out: 1445 Time Total: 30 minutes Greater than 50%  of this time was spent counseling and coordinating care related to the above assessment and plan.  Signed by: Katheran Aweasha A Dove, NP   Please contact Palliative Medicine Team phone at [phone] for questions and concerns.  For individual provider: See Loretha StaplerAmion

## 2017-06-22 DEATH — deceased

## 2017-06-23 LAB — CULTURE, BLOOD (ROUTINE X 2): Culture: NO GROWTH
